# Patient Record
Sex: Male | Born: 1990 | Race: White | Hispanic: No | Marital: Single | Smoking: Never smoker
Health system: Southern US, Community
[De-identification: ages and names within clinical notes are randomized; demographics above are authoritative.]

## PROBLEM LIST (undated history)

## (undated) DIAGNOSIS — M79606 Pain in leg, unspecified: Secondary | ICD-10-CM

## (undated) DIAGNOSIS — L409 Psoriasis, unspecified: Secondary | ICD-10-CM

## (undated) DIAGNOSIS — K922 Gastrointestinal hemorrhage, unspecified: Secondary | ICD-10-CM

## (undated) DIAGNOSIS — F419 Anxiety disorder, unspecified: Secondary | ICD-10-CM

## (undated) DIAGNOSIS — K5900 Constipation, unspecified: Secondary | ICD-10-CM

## (undated) DIAGNOSIS — K264 Chronic or unspecified duodenal ulcer with hemorrhage: Secondary | ICD-10-CM

## (undated) DIAGNOSIS — F329 Major depressive disorder, single episode, unspecified: Secondary | ICD-10-CM

## (undated) DIAGNOSIS — R519 Headache, unspecified: Secondary | ICD-10-CM

## (undated) DIAGNOSIS — E669 Obesity, unspecified: Secondary | ICD-10-CM

## (undated) DIAGNOSIS — K279 Peptic ulcer, site unspecified, unspecified as acute or chronic, without hemorrhage or perforation: Secondary | ICD-10-CM

## (undated) DIAGNOSIS — F32A Depression, unspecified: Secondary | ICD-10-CM

## (undated) DIAGNOSIS — R51 Headache: Secondary | ICD-10-CM

## (undated) DIAGNOSIS — K260 Acute duodenal ulcer with hemorrhage: Secondary | ICD-10-CM

## (undated) HISTORY — DX: Headache, unspecified: R51.9

## (undated) HISTORY — DX: Major depressive disorder, single episode, unspecified: F32.9

## (undated) HISTORY — DX: Constipation, unspecified: K59.00

## (undated) HISTORY — DX: Anxiety disorder, unspecified: F41.9

## (undated) HISTORY — DX: Psoriasis, unspecified: L40.9

## (undated) HISTORY — DX: Acute duodenal ulcer with hemorrhage: K26.0

## (undated) HISTORY — DX: Depression, unspecified: F32.A

## (undated) HISTORY — DX: Pain in leg, unspecified: M79.606

## (undated) HISTORY — DX: Peptic ulcer, site unspecified, unspecified as acute or chronic, without hemorrhage or perforation: K27.9

## (undated) HISTORY — DX: Chronic or unspecified duodenal ulcer with hemorrhage: K26.4

## (undated) HISTORY — DX: Gastrointestinal hemorrhage, unspecified: K92.2

## (undated) HISTORY — DX: Obesity, unspecified: E66.9

## (undated) HISTORY — DX: Headache: R51

---

## 2010-02-02 ENCOUNTER — Ambulatory Visit: Payer: Self-pay | Admitting: Interventional Radiology

## 2010-02-02 ENCOUNTER — Emergency Department (HOSPITAL_BASED_OUTPATIENT_CLINIC_OR_DEPARTMENT_OTHER): Admission: EM | Admit: 2010-02-02 | Discharge: 2010-02-02 | Payer: Self-pay | Admitting: Emergency Medicine

## 2010-02-02 ENCOUNTER — Ambulatory Visit: Payer: Self-pay | Admitting: Emergency Medicine

## 2010-02-02 DIAGNOSIS — R1084 Generalized abdominal pain: Secondary | ICD-10-CM

## 2010-08-23 HISTORY — PX: WISDOM TOOTH EXTRACTION: SHX21

## 2010-09-22 NOTE — Assessment & Plan Note (Signed)
Summary: STOMACH PAIN/WB   Vital Signs:  Patient Profile:   20 Years Old Male CC:      lower back and lower abdominal pain, testicular soreness X 1 hour ago Height:     70 inches Weight:      167 pounds O2 Sat:      99 % O2 treatment:    Room Air Temp:     97.0 degrees F oral Pulse rate:   87 / minute Resp:     18 per minute BP sitting:   115 / 73  (right arm) Cuff size:   regular  Pt. in pain?   yes    Location:   lower back radiating to lower abdomen    Intensity:   7    Type:       aching with sharp at times  Vitals Entered By: Lajean Saver RN (February 02, 2010 1:32 PM)                   Current Allergies: ! * SEASONALHistory of Present Illness History from: patient and mom Chief Complaint: lower back and lower abdominal pain, testicular soreness X 1 hour ago History of Present Illness: 20yo WM with abd pain for 1 hour.  Started as testicular pain radiating to right flank and RLQ.  No problems this morning with urination.  No trauma.  No physical activity.  7/10 pain, no N/V or other constitutional symptoms.  No dysuria, constipation, frequency.  He has never been sexually active.  REVIEW OF SYSTEMS Constitutional Symptoms      Denies fever, chills, night sweats, weight loss, weight gain, and fatigue.  Eyes       Denies change in vision, eye pain, eye discharge, glasses, contact lenses, and eye surgery. Ear/Nose/Throat/Mouth       Denies hearing loss/aids, change in hearing, ear pain, ear discharge, dizziness, frequent runny nose, frequent nose bleeds, sinus problems, sore throat, hoarseness, and tooth pain or bleeding.  Respiratory       Denies dry cough, productive cough, wheezing, shortness of breath, asthma, bronchitis, and emphysema/COPD.  Cardiovascular       Denies murmurs, chest pain, and tires easily with exhertion.    Gastrointestinal       Complains of stomach pain.      Denies nausea/vomiting, diarrhea, constipation, blood in bowel movements, and  indigestion.      Comments: lowerlower back Genitourniary       Denies painful urination, kidney stones, and loss of urinary control.      Comments: aching testicles Neurological       Denies paralysis, seizures, and fainting/blackouts. Musculoskeletal       Complains of muscle pain.      Denies joint pain, joint stiffness, decreased range of motion, redness, swelling, muscle weakness, and gout.  Skin       Denies bruising, unusual mles/lumps or sores, and hair/skin or nail changes.  Psych       Denies mood changes, temper/anger issues, anxiety/stress, speech problems, depression, and sleep problems. Other Comments: symptoms started one hour ago in lower back. denies N/V/D   Past History:  Past Medical History: Unremarkable  Past Surgical History: Denies surgical history  Family History: Chrones- grandfather Factor 5- mother Breast CA- mother  Social History: Never Smoked Alcohol use-no Drug use-no Smoking Status:  never Drug Use:  no Physical Exam General appearance: moderate distress, A&Ox3 Head: normocephalic, atraumatic Chest/Lungs: no rales, wheezes, or rhonchi bilateral, breath sounds equal without effort Heart:  regular rate and  rhythm, no murmur Abdomen: +TTP RLQ and suprapubic and some generalized tenderness.  No rebound.  +guarding.  +BS4Q. GU: +TTP R>L testicle, neither is high-riding, no hernia felt MSE: oriented to time, place, and person Assessment New Problems: ABDOMINAL PAIN (ICD-789.00)   The patient and/or caregiver has been counseled thoroughly with regard to medications prescribed including dosage, schedule, interactions, rationale for use, and possible side effects and they verbalize understanding.  Diagnoses and expected course of recovery discussed and will return if not improved as expected or if the condition worsens. Patient and/or caregiver verbalized understanding.   Patient Instructions: 1)  Patient & mother advised to go directly to the  ER for UA, blood work, and CT scan with possible U/S or testicles.  They agree and will go now.  Orders Added: 1)  New Patient Level II [99202]

## 2010-11-09 LAB — URINALYSIS, ROUTINE W REFLEX MICROSCOPIC
Bilirubin Urine: NEGATIVE
Nitrite: NEGATIVE
Protein, ur: NEGATIVE mg/dL
Urobilinogen, UA: 0.2 mg/dL (ref 0.0–1.0)
pH: 8 (ref 5.0–8.0)

## 2012-03-15 ENCOUNTER — Ambulatory Visit (INDEPENDENT_AMBULATORY_CARE_PROVIDER_SITE_OTHER): Payer: Self-pay | Admitting: Physician Assistant

## 2012-03-15 ENCOUNTER — Encounter: Payer: Self-pay | Admitting: Physician Assistant

## 2012-03-15 VITALS — BP 117/74 | HR 78 | Ht 72.0 in | Wt 211.0 lb

## 2012-03-15 DIAGNOSIS — F411 Generalized anxiety disorder: Secondary | ICD-10-CM

## 2012-03-15 DIAGNOSIS — F329 Major depressive disorder, single episode, unspecified: Secondary | ICD-10-CM

## 2012-03-15 DIAGNOSIS — F3289 Other specified depressive episodes: Secondary | ICD-10-CM

## 2012-03-15 DIAGNOSIS — F419 Anxiety disorder, unspecified: Secondary | ICD-10-CM

## 2012-03-15 MED ORDER — SERTRALINE HCL 50 MG PO TABS: 50.0000 mg | ORAL_TABLET | Freq: Every day | ORAL | Status: DC

## 2012-03-15 NOTE — Progress Notes (Signed)
  Subjective:    Patient ID: Leonard Lucas, male    DOB: 26-Mar-1991, 21 y.o.   MRN: [id number]  HPI Patient presents to the clinic as a new patient to establish care. Past medical history was negative for any diseases or medical management. He is not currently on any medications. He comes in to discuss worsening depression. He has never sought any medical help with feelings of hopelessness and helplessness. He reports that he history we'll with feeling down since he was in middle school. It worse and when he went to high school. He is now 21 years old and graduated from high school working at [employer]. Last week he knew he needed to seek help when he thought about taking a whole bottle of ibuprofen to see if it would hurt him. This was started by an incident where he passed gas in front of a  Shopper. This really embarrassed down and he felt like he can do nothing right. It is also bothering him that his parents are going through a divorce. He feels like he has no one to talk to despite having friends. He is having problems focusing to do his class work while he takes classes at [employer]. All he wants to do is sleep. He has not wanted to eat at all. He does have an appointment to talk to a therapist tomorrow.     Review of Systems     Objective:   Physical Exam  Constitutional: He is oriented to person, place, and time. He appears well-developed and well-nourished.  HENT:  Head: Normocephalic.  Cardiovascular: Normal rate, regular rhythm and normal heart sounds.   Pulmonary/Chest: Effort normal and breath sounds normal.  Neurological: He is alert and oriented to person, place, and time.  Skin: Skin is warm and dry.  Psychiatric:        Flat affect. Patient cried during encounter.          Assessment & Plan:  Anxiety and depression-PHQ-9 was 22 and GAD-7 was 17. Patient was started on Zoloft 50 mg one half tab for 7 days then increase to 1 tab. He was given a handout on  depression. He is aware of the potential side effects of Zoloft and was told to call office if he had any worsening depression. Patient was given a number for suicide hotline if he had any more thoughts of hurting himself or others. He was encouraged to keep appointment with therapist tomorrow. Followup in 4-6 weeks.

## 2012-03-15 NOTE — Patient Instructions (Addendum)
Exercise can increase feel good hormones. Will start Zoloft 1/2 tab for 7 days then increase to 1 tab. Follow up in 6 weeks. Call if any side effects or worsening depression.   Depression, Adolescent and Adult Depression is a true and treatable medical condition. In general there are two kinds of depression:  Depression we all experience in some form. For example depression from the death of a loved one, financial distress or natural disasters will trigger or increase depression.   Clinical depression, on the other hand, appears without an apparent cause or reason. This depression is a disease. Depression may be caused by chemical imbalance in the body and brain or may come as a response to a physical illness. Alcohol and other drugs can cause depression.  DIAGNOSIS  The diagnosis of depression is usually based upon symptoms and medical history. TREATMENT  Treatments for depression fall into three categories. These are:  Drug therapy. There are many medicines that treat depression. Responses may vary and sometimes trial and error is necessary to determine the best medicines and dosage for a particular patient.   Psychotherapy, also called talking treatments, helps people resolve their problems by looking at them from a different point of view and by giving people insight into their own personal makeup. Traditional psychotherapy looks at a childhood source of a problem. Other psychotherapy will look at current conflicts and move toward solving those. If the cause of depression is drug use, counseling is available to help abstain. In time the depression will usually improve. If there were underlying causes for the chemical use, they can be addressed.   ECT (electroconvulsive therapy) or shock treatment is not as commonly used today. It is a very effective treatment for severe suicidal depression. During ECT electrical impulses are applied to the head. These impulses cause a generalized seizure. It can  be effective but causes a loss of memory for recent events. Sometimes this loss of memory may include the last several months.  Treat all depression or suicide threats as serious. Obtain professional help. Do not wait to see if serious depression will get better over time without help. Seek help for yourself or those around you. In the [place] the number to the National Suicide Help Lines With 24 Hour Help Are: 1-800-SUICIDE (931)581-6327 Document Released: 08/06/2000 Document Revised: 07/29/2011 Document Reviewed: 03/27/2008 [facility] Patient Information 2012 [place], [place].

## 2012-03-16 ENCOUNTER — Ambulatory Visit (INDEPENDENT_AMBULATORY_CARE_PROVIDER_SITE_OTHER): Payer: 59 | Admitting: Psychiatry

## 2012-03-16 ENCOUNTER — Encounter (HOSPITAL_COMMUNITY): Payer: Self-pay | Admitting: Psychiatry

## 2012-03-16 VITALS — BP 126/74 | HR 78 | Ht 72.0 in | Wt 225.0 lb

## 2012-03-16 DIAGNOSIS — F331 Major depressive disorder, recurrent, moderate: Secondary | ICD-10-CM | POA: Insufficient documentation

## 2012-03-16 DIAGNOSIS — F332 Major depressive disorder, recurrent severe without psychotic features: Secondary | ICD-10-CM

## 2012-03-16 NOTE — Progress Notes (Signed)
Psychiatric Assessment Adult  Patient Identification:  Leonard Lucas Date of Evaluation:  03/16/2012 Chief Complaint:   Chief Complaint  Patient presents with  . Anxiety  . Depression   History of Chief Complaint:   HPI Comments: Leonard Lucas is a 21 y/o male with a past psychiatric history significant for symptoms of depression and anxiety. The patient is referred for psychiatric services for psychiatric evaluation and medication.   The patient reports that her main stressors are: " Past Memories and failures"- the patient reports a relationship ended and that person tried to kill himself and he has carried the guilt of this since 6 years-he states that this was an online relationship but that they are still friends; "Instability at work"- he reports that a Writer has come in and his work duties changed as their were issues with the International aid/development worker; "parents splitting up"- he reports that his parents are separating; Mother had cancer- he reports that he trying not think about it as his mother has been in remission.   Anxiety Presents for initial visit. Onset was more than 5 years ago. The problem has been gradually worsening. Symptoms include confusion, decreased concentration, depressed mood, excessive worry, feeling of choking, irritability, nervous/anxious behavior and obsessions. Patient reports no chest pain, hyperventilation, palpitations or panic. Symptoms occur most days. The most recent episode lasted 3 hours. The severity of symptoms is causing significant distress. The symptoms are aggravated by family issues and work stress. The patient sleeps 9 hours per night. The quality of sleep is non-restorative. Nighttime awakenings: one to two.   Risk factors include a major life event and family history. Past treatments include nothing. The treatment provided no relief. Compliance with prior treatments has been good.   In the area of affective symptoms, patient appears mildly anxious.  Patient denies current suicidal ideation, intent, or plan. Patient reports that he had suicidal thoughts two weeks ago for a couple hours, but then overcame.   Patient denies current homicidal ideation, intent, or plan. Patient denies auditory hallucinations. Patient denies visual hallucinations. Patient endorses symptoms of paranoia-thinks that people think he is a bad person. He has been thinking that his thoughts are broadcasted since 6th grade. Patient states sleep is poor, though he sleeps approximately 8-10 hours of sleep per night.  Appetite is fair. Energy level poor. Patient endorses symptoms of anhedonia for 5 years. Patient endorses hopelessness, helplessness, and guilt-( regarding her past 2 relationships.)   Denies any recent episodes consistent with mania, particularly decreased need for sleep with increased energy, grandiosity, impulsivity, hyperverbal and pressured speech, or increased productivity. Denies any recent symptoms consistent with psychosis, particularly auditory or visual hallucinations, insertion/withdrawal, or ideas of reference. . Denies any history of trauma or symptoms consistent with PTSD such as flashbacks, nightmares, hypervigilance, feelings of numbness or inability to connect with others.   The patient reports his symptoms were present but "mild" throughout middle school and increased during highschool. He reports his symptom lead to lack of energy and concentration to projects.  Review of Systems  Constitutional: Positive for appetite change and irritability. Negative for fever, chills, diaphoresis, activity change, fatigue and unexpected weight change.  Respiratory: Negative.   Cardiovascular: Negative.  Negative for chest pain, palpitations and leg swelling.  Gastrointestinal: Negative.   Psychiatric/Behavioral: Positive for confusion and decreased concentration. The patient is nervous/anxious.    Physical Exam  Vitals reviewed. Constitutional: He appears  well-developed and well-nourished. No distress.  Skin: He is not diaphoretic.  Past Psychiatric History: Diagnosis: None  Hospitalizations: Patient denies.  Outpatient Care: Patient denies.  Substance Abuse Care:Patient denies.  Self-Mutilation: The patient cuts himself since age 36 periodically up until 2012  Suicidal Attempts: Patient denies.  Violent Behaviors: Patient denies.   Past Medical History:  History reviewed. No pertinent past medical history.  History of Loss of Consciousness:  No Seizure History:  No Cardiac History:  No Allergies:  No Known Allergies  Current Medications:  Current Outpatient Prescriptions  Medication Sig Dispense Refill  . sertraline (ZOLOFT) 50 MG tablet Take 1 tablet (50 mg total) by mouth daily. Take 1/2 tab for 7 days then increase to 1 tab.  30 tablet  1   Previous Psychotropic Medications:  Medication Dose   Sertraline-started yesterday  25 mg    Substance Abuse History in the last 12 months: SUBSTANCE USE HISTORY:  Caffeine: Caffeinated Beverages 16 ounces person. Nicotine: Patient denies.  Alcohol: Patient denies.  Illicit Drugs: Patient denies.   MENTAL ILLNESS AND SUBSTANCE ABUSE IN FAMILY MEMBERS:  Psychiatric illness: Mother -depression Substance abuse: Mother Nicotine use. Suicides: Patient denies.  Social History: Current Place of Residence: [place], [place] Place of Birth: [place], [place] Family Members: Lives with his father Marital Status:  Single Children: None Relationships:  Education:  Software engineer Problems/Performance: A's; B's Religious Beliefs/Practices: None History of Abuse: none Occupational Experiences: Sales associate in [employer] History:  None. Legal History: None  Hobbies/Interests: Online gaming and chatting.   Family History:  History reviewed. No pertinent family history.  Mental Status Examination/Evaluation: Objective:  Appearance: Casual  Eye Contact::  Fair    Speech:  Clear and Coherent and Normal Rate  Volume:  Normal  Mood:  "Average."  Affect:  Blunt  Thought Process:  Circumstantial, Linear and Logical  Orientation:  Full  Thought Content:  WDL  Suicidal Thoughts:  No  Homicidal Thoughts:  No  Judgement:  Good  Insight:  Good  Psychomotor Activity:  Normal  Akathisia:  No  Handed:  Right  AIMS (if indicated):  None  Assets:  Communication Skills Desire for Improvement Financial Resources/Insurance Housing Leisure Time Physical Health Social Support Talents/Skills Transportation    Laboratory/X-Ray Psychological Evaluation(s)   None   None   Assessment:    AXIS I Major Depression, Recurrent severe  AXIS II No diagnosis  AXIS III History reviewed. No pertinent past medical history.   AXIS IV educational problems, housing problems, occupational problems and other psychosocial or environmental problems-parents are separating  AXIS V 51-60 moderate symptoms   Treatment Plan/Recommendations:  1. Affirm with the patient that the medications are taken as ordered. Patient expressed understanding of how their medications were to be used.  2. Continue the following psychiatric medications as written prior to this appointment:  a) Continue Sertraline 25 mg for 6 days then increase to one whole tablet 50 mg daily. 3. Therapy: brief supportive therapy provided. Discussed psychosocial stressors in detail.  4. Risks and benefits, side effects and alternatives discussed with patient, he was given an opportunity to ask questions about his medication, illness, and treatment. All current psychiatric  medications have been reviewed and discussed with the patient and adjusted as clinically appropriate. The patient has been provided an accurate and updated list of the medications being now prescribed.  5. Patient told to call clinic if any problems occur. Patient advised to go to ER  if he should develop SI/HI, side effects, or if symptoms  worsen. Has crisis numbers to  call if needed.   6. No labs warranted at this time. 7. The patient was encouraged to keep all PCP and specialty clinic appointments.  8. Patient was instructed to return to clinic in 1 month.  9. The patient was advised to call and cancel their mental health appointment within 24 hours of the appointment, if they are unable to keep the appointment.  10. The patient expressed understanding of the plan and agrees with the above.  Jacqulyn Cane, MD 7/25/20139:03 AM

## 2012-03-29 ENCOUNTER — Encounter: Payer: Self-pay | Admitting: Physician Assistant

## 2012-03-29 ENCOUNTER — Ambulatory Visit (INDEPENDENT_AMBULATORY_CARE_PROVIDER_SITE_OTHER): Payer: 59 | Admitting: Physician Assistant

## 2012-03-29 VITALS — BP 133/81 | HR 99 | Ht 72.0 in | Wt 208.0 lb

## 2012-03-29 DIAGNOSIS — F410 Panic disorder [episodic paroxysmal anxiety] without agoraphobia: Secondary | ICD-10-CM

## 2012-03-29 DIAGNOSIS — F329 Major depressive disorder, single episode, unspecified: Secondary | ICD-10-CM

## 2012-03-29 DIAGNOSIS — F419 Anxiety disorder, unspecified: Secondary | ICD-10-CM

## 2012-03-29 DIAGNOSIS — F3289 Other specified depressive episodes: Secondary | ICD-10-CM

## 2012-03-29 DIAGNOSIS — F411 Generalized anxiety disorder: Secondary | ICD-10-CM

## 2012-03-29 MED ORDER — CLONAZEPAM 0.25 MG PO TBDP: 0.2500 mg | ORAL_TABLET | ORAL | Status: DC | PRN

## 2012-03-29 MED ORDER — ESCITALOPRAM OXALATE 10 MG PO TABS: 10.0000 mg | ORAL_TABLET | Freq: Every day | ORAL | Status: DC

## 2012-03-29 NOTE — Progress Notes (Signed)
  Subjective:    Patient ID: Leonard Lucas, male    DOB: 07-Sep-1990, 21 y.o.   MRN: [id number]  HPI Patient comes into today because depression and anxiety have worsened since starting Zoloft. He noticed that the worsening feelings of hopelessness and helplessness came when he went up to full dose. He did go to see Dr. Blenda Peals downstairs and no changes were made to his meds. He does have a follow up with him in a couple of weeks. He does report that his thoughts of not wanting to exist have increased. Last dose of Zoloft was Sunday, 3 days ago. Sunday was also when his dad came home drunk talking about divorce to him. This made him feel worse. Denies any illegal drugs or alcohol usage. Although feels like he doesn't want to exist he denies any plans of suicide and wants to be happy again. He has had recent episode of anxiety where he feels like he can't function and his chest begins to feel tight. This happened when his dad came home drunk on Sunday.   Review of Systems     Objective:   Physical Exam  Constitutional: He is oriented to person, place, and time. He appears well-developed and well-nourished.  HENT:  Head: Normocephalic and atraumatic.  Cardiovascular: Normal rate, regular rhythm and normal heart sounds.   Pulmonary/Chest: Effort normal and breath sounds normal.  Neurological: He is alert and oriented to person, place, and time.  Skin: Skin is warm and dry.  Psychiatric:       Cried most of encounter. Flat affect.           Assessment & Plan:  Depression/anxiety/panic attacks-GAD-7 was 20 and PHQ-9 was 24. pt has stopped Zoloft. Gave rx for lexapro to start along with clonazepam prn for acute attacks. Discussed with patient how Dr. Demetrius Charity downstairs is the one he needs to follow up. I want him to keep appt. Gave handout for Depression and numbers to call if he had thoughts of suicide. I also told him to call if he started medication and he thought symptoms were worsening so that he  could stop and maybe get him in sooner downstairs. Think he would benefit from counseling on a regular basis weekly. Told pt to discuss with Dr. Demetrius Charity.

## 2012-03-29 NOTE — Patient Instructions (Addendum)
Need to follow up with Dr. Demetrius Charity.  Depression, Adolescent and Adult Depression is a true and treatable medical condition. In general there are two kinds of depression:  Depression we all experience in some form. For example depression from the death of a loved one, financial distress or natural disasters will trigger or increase depression.   Clinical depression, on the other hand, appears without an apparent cause or reason. This depression is a disease. Depression may be caused by chemical imbalance in the body and brain or may come as a response to a physical illness. Alcohol and other drugs can cause depression.  DIAGNOSIS  The diagnosis of depression is usually based upon symptoms and medical history. TREATMENT  Treatments for depression fall into three categories. These are:  Drug therapy. There are many medicines that treat depression. Responses may vary and sometimes trial and error is necessary to determine the best medicines and dosage for a particular patient.   Psychotherapy, also called talking treatments, helps people resolve their problems by looking at them from a different point of view and by giving people insight into their own personal makeup. Traditional psychotherapy looks at a childhood source of a problem. Other psychotherapy will look at current conflicts and move toward solving those. If the cause of depression is drug use, counseling is available to help abstain. In time the depression will usually improve. If there were underlying causes for the chemical use, they can be addressed.   ECT (electroconvulsive therapy) or shock treatment is not as commonly used today. It is a very effective treatment for severe suicidal depression. During ECT electrical impulses are applied to the head. These impulses cause a generalized seizure. It can be effective but causes a loss of memory for recent events. Sometimes this loss of memory may include the last several months.  Treat all  depression or suicide threats as serious. Obtain professional help. Do not wait to see if serious depression will get better over time without help. Seek help for yourself or those around you. In the [place] the number to the National Suicide Help Lines With 24 Hour Help Are: 1-800-SUICIDE (404)129-0115 Document Released: 08/06/2000 Document Revised: 07/29/2011 Document Reviewed: 03/27/2008 [facility] Patient Information 2012 [place], [place].

## 2012-04-20 ENCOUNTER — Ambulatory Visit (HOSPITAL_COMMUNITY): Payer: Self-pay | Admitting: Psychiatry

## 2012-05-03 ENCOUNTER — Encounter (HOSPITAL_COMMUNITY): Payer: Self-pay | Admitting: Psychiatry

## 2012-05-03 ENCOUNTER — Ambulatory Visit (INDEPENDENT_AMBULATORY_CARE_PROVIDER_SITE_OTHER): Payer: 59 | Admitting: Psychiatry

## 2012-05-03 VITALS — BP 125/81 | HR 73 | Ht 72.0 in | Wt 212.0 lb

## 2012-05-03 DIAGNOSIS — F331 Major depressive disorder, recurrent, moderate: Secondary | ICD-10-CM

## 2012-05-03 DIAGNOSIS — F332 Major depressive disorder, recurrent severe without psychotic features: Secondary | ICD-10-CM

## 2012-05-03 MED ORDER — ESCITALOPRAM OXALATE 10 MG PO TABS: 10.0000 mg | ORAL_TABLET | Freq: Every day | ORAL | Status: DC

## 2012-05-03 NOTE — Progress Notes (Signed)
[facility] Follow-up Outpatient Visit  Leonard Lucas [birth year]  DATE: 05/05/2012  .  Anxiety   .  Depression    History of Chief Complaint:  HPI Comments: Leonard Lucas is a 21 y/o male with a past psychiatric history significant for symptoms of depression and anxiety. The patient is referred for psychiatric services for medication management.   The patient reports that his was not doing well on his previous antidepressant and went to his PCP.  He reports he was switched to clonazepam (which has not used in over one week) and escitalopram 10 mg. He states he is doing well, and denies any side effects.  The patient reports that he is dealing with work related stress better. The patient reports that he online relationship with his friends remain the same. The patient reports that his parents relationship is improving which has improved his mood. He reports his mother moved out 2 months ago, but has recently starting going out on dates.  He reports that he felt he was getting "manic."  He report his energy levels were fluctuating from high to low. He reports he was fluctuating from pressured speech to not talking. He reports that he had one night of not sleep. He does report some stress about his dog who is 59 years old and suffering some health problems.  In the area of affective symptoms, patient appears euthymic. Patient denies current suicidal ideation, intent, or plan. Patient denies current homicidal ideation, intent, or plan. Patient denies auditory hallucinations. Patient denies visual hallucinations. Patient denies symptoms of paranoia. Patient states sleep is good. Appetite is good. Energy level is good. Patient denies symptoms of anhedonia. Patient denies hopelessness, helplessness, or guilt.    Denies any recent episodes consistent with mania, particularly decreased need for sleep with increased energy, grandiosity, impulsivity, hyperverbal and pressured speech, or increased  productivity. Denies any recent symptoms consistent with psychosis, particularly auditory or visual hallucinations, thought broadcasting/insertion/withdrawal, or ideas of reference. Also denies excessive worry to the point of physical symptoms as well as any panic attacks. Denies any history of trauma or symptoms consistent with PTSD such as flashbacks, nightmares, hypervigilance, feelings of numbness or inability to connect with others.   Review of Systems  Constitutional:  Negative for change in appetite, fever, chills, diaphoresis, activity change, fatigue and unexpected weight change.  Respiratory: Negative.  Cardiovascular: Negative. Negative for chest pain, palpitations and leg swelling.  Gastrointestinal: Negative.    Physical Exam  Vitals reviewed.  Constitutional: He appears well-developed and well-nourished. No distress.  Skin: He is not diaphoretic.   Past Psychiatric History:  Diagnosis: None   Hospitalizations: Patient denies.   Outpatient Care: Patient denies.   Substance Abuse Care:Patient denies.   Self-Mutilation: The patient cuts himself since age 74 periodically up until 2012   Suicidal Attempts: Patient denies.   Violent Behaviors: Patient denies.    Past Medical History: History reviewed. No pertinent past medical history.   History of Loss of Consciousness: No  Seizure History: No  Cardiac History: No  Allergies: No Known Allergies   Current Medications: Current Outpatient Prescriptions on File Prior to Visit  Medication Sig Dispense Refill  . escitalopram (LEXAPRO) 10 MG tablet Take 1 tablet (10 mg total) by mouth daily.  30 tablet  1  . clonazePAM (KLONOPIN) 0.25 MG disintegrating tablet Take 1 tablet (0.25 mg total) by mouth as needed. For anxiety no more than twice a day.  30 tablet  0   Previous Psychotropic  Medications:  Medication  Dose   Sertraline-started yesterday  25 mg    Substance Abuse History in the last 12 months:   SUBSTANCE USE  HISTORY:  Caffeine: Caffeinated Beverages 16 ounces person.  Nicotine: Patient denies.  Alcohol: Patient denies.  Illicit Drugs: Patient denies.   MENTAL ILLNESS AND SUBSTANCE ABUSE IN FAMILY MEMBERS:  Psychiatric illness: Mother -depression  Substance abuse: Mother Nicotine use.  Suicides: Patient denies.   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  Legal History: None  Hobbies/Interests: Online gaming and chatting.   Family History: History reviewed. No pertinent family history.   Mental Status Examination/Evaluation:  Objective: Appearance: Casual   Eye Contact:: Fair   Speech: Clear and Coherent and Normal Rate   Volume: Normal   Mood: "positive"   Affect: Blunt   Thought Process: Circumstantial, Linear and Logical   Orientation: Full   Thought Content: WDL   Suicidal Thoughts: No   Homicidal Thoughts: No   Judgement: Good   Insight: Good   Psychomotor Activity: Normal   Akathisia: No   Handed: Right   AIMS (if indicated): None   Assets: Communication Skills  Desire for Improvement  Financial Resources/Insurance  Housing  Leisure Time  Physical Health  Social Support  Talents/Skills  Transportation    Laboratory/X-Ray  Psychological Evaluation(s)   None  None    Assessment:  AXIS I  Major Depression, Recurrent severe   AXIS II  No diagnosis   AXIS III  History reviewed. No pertinent past medical history.   AXIS IV  educational problems, housing problems, occupational problems and other psychosocial or environmental problems-parents are separating   AXIS V  51-60 moderate symptoms    Treatment  Plan/Recommendations:  1. Affirm with the patient that the medications are taken as ordered. Patient expressed understanding of how their medications were to be used.  I applauded the patient's efforts to take control of his mental health and seek treatment when he was having problems with his medications, and advised him of the option to call this clinic if he should have problems with the same. 2. Continue the following psychiatric medications as written prior to this appointment:  a) Lexapro 10 mg- I have asked the patient to call the clinic if he is having a return of depressive symptoms prior to his next appointment, if so will increase medications. Will monitor for manic symptoms.  B) Will not refill clonazepam, advised patient to take remaining medication only if he has a panic attack. 3. Therapy: brief supportive therapy provided. Discussed psychosocial stressors in detail.  4. Risks and benefits, side effects and alternatives discussed with patient, he was given an opportunity to ask questions about his medication, illness, and treatment. All current psychiatric  medications have been reviewed and discussed with the patient and adjusted as clinically appropriate. The patient has been provided an accurate and updated list of the medications being now prescribed.  5. Patient told to call clinic if any problems occur. Patient advised to go to ER if he should develop SI/HI, side effects, or if symptoms worsen. Has crisis numbers to call if needed.  6. No labs warranted at this time.  7. The  patient was encouraged to keep all PCP and specialty clinic appointments.  8. Patient was instructed to return to clinic in 1 month.  9. The patient was advised to call and cancel their mental health appointment within 24 hours of the appointment, if they are unable to keep the appointment.  10. The patient expressed understanding of the plan and agrees with the above.     Jacqulyn Cane, MD

## 2012-05-31 ENCOUNTER — Ambulatory Visit (INDEPENDENT_AMBULATORY_CARE_PROVIDER_SITE_OTHER): Payer: 59 | Admitting: Psychiatry

## 2012-05-31 ENCOUNTER — Encounter (HOSPITAL_COMMUNITY): Payer: Self-pay | Admitting: Psychiatry

## 2012-05-31 VITALS — BP 105/68 | HR 61 | Ht 72.0 in | Wt 216.0 lb

## 2012-05-31 DIAGNOSIS — F339 Major depressive disorder, recurrent, unspecified: Secondary | ICD-10-CM

## 2012-05-31 DIAGNOSIS — F331 Major depressive disorder, recurrent, moderate: Secondary | ICD-10-CM

## 2012-05-31 MED ORDER — ESCITALOPRAM OXALATE 10 MG PO TABS: 10.0000 mg | ORAL_TABLET | Freq: Every day | ORAL | Status: DC

## 2012-05-31 NOTE — Progress Notes (Signed)
[facility] Follow-up Outpatient Visit  Leonard Lucas April 07, 1991  Date: 05/31/2012  .  Anxiety   .  Depression    History of Chief Complaint:    HPI Comments: Leonard Lucas is a 21 y/o male with a past psychiatric history significant for symptoms of depression and anxiety. The patient is referred for psychiatric services for medication management.  he patient had one week of irritability which resolved. He reports that the week of irritability was secondary to some work related stressors. He reports he would like to go to [facility] next month, and plans to go with his father.  He has taken a gym membership but has not gone as of yet. The patient reports that he has been trying to end a relationship with a person he had dated in the past but feels like he would be a terrible person if he did.  He does however realize that that person is not good for him emotionally.  In the area of affective symptoms, patient appears euthymic. Patient denies current suicidal ideation, intent, or plan. Patient denies current homicidal ideation, intent, or plan. Patient denies auditory hallucinations. Patient denies visual hallucinations. Patient denies symptoms of paranoia. Patient states sleep is good. Appetite is good. Energy level is increased. Patient denies symptoms of anhedonia. Patient denies hopelessness, helplessness, or guilt.   Denies any  episodes consistent with mania, particularly decreased need for sleep with increased energy, grandiosity, impulsivity, hyperverbal and pressured speech, or increased productivity. Denies any recent symptoms consistent with psychosis, particularly auditory or visual hallucinations, thought broadcasting/insertion/withdrawal, or ideas of reference. Also denies excessive worry to the point of physical symptoms as well as any panic attacks. Denies any history of trauma or symptoms consistent with PTSD such as flashbacks, nightmares, hypervigilance, feelings of numbness  or inability to connect with others.   Review of Systems  Constitutional: Negative for change in appetite, fever, chills, diaphoresis, activity change, fatigue and unexpected weight change.  Respiratory: Negative.  Cardiovascular: Negative. Negative for chest pain, palpitations and leg swelling.  Gastrointestinal: Negative.   Filed Vitals:   05/31/12 1300  BP: 105/68  Pulse: 61  Height: 6' (1.829 m)  Weight: 216 lb (97.977 kg)    Physical Exam  Vitals reviewed.  Constitutional: He appears well-developed and well-nourished. No distress.  Skin: He is not diaphoretic.   Past Psychiatric History:  Diagnosis: None   Hospitalizations: Patient denies.   Outpatient Care: Patient denies.   Substance Abuse Care:Patient denies.   Self-Mutilation: The patient cuts himself since age 58 periodically up until 2012   Suicidal Attempts: Patient denies.   Violent Behaviors: Patient denies.    Past Medical History: History reviewed. No pertinent past medical history.  History of Loss of Consciousness: No   Seizure History: No  Cardiac History: No  Allergies: No Known Allergies   Current Medications: Reviewed Current Outpatient Prescriptions on File Prior to Visit   Medication  Sig  Dispense  Refill   .  escitalopram (LEXAPRO) 10 MG tablet  Take 1 tablet (10 mg total) by mouth daily.  30 tablet  1   .  clonazePAM (KLONOPIN) 0.25 MG disintegrating tablet  Has taken one tablet a day for 7 days two weeks ago     Previous Psychotropic Medications: Reviewed Medication  Dose   Sertraline-started yesterday  25 mg     SUBSTANCE USE HISTORY: Reviewed Caffeine: Caffeinated Beverages 16 ounces person.  Nicotine: Patient denies.  Alcohol: Patient denies.  Illicit Drugs: Patient denies.  MENTAL ILLNESS AND SUBSTANCE ABUSE IN FAMILY MEMBERS: Reviewed Psychiatric illness: Mother -depression  Substance abuse: Mother Nicotine use.  Suicides: Patient denies.   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  Legal History: None  Hobbies/Interests: Online gaming and chatting.  Family History: History reviewed. No pertinent family history.    Mental Status Examination/Evaluation:  Objective: Appearance: Casual   Eye Contact:: Fair   Speech: Clear and Coherent and Normal Rate   Volume: Normal   Mood: "iffy-not real good, not real bad"   Affect: Blunt   Thought Process: Circumstantial, Linear and Logical   Orientation: Full   Thought Content: WDL   Suicidal Thoughts: No   Homicidal Thoughts: No   Judgement: Good   Insight: Good   Psychomotor Activity: Normal   Akathisia: No   Handed: Right   AIMS (if indicated): None   Assets: Communication Skills  Desire for Improvement  Financial Resources/Insurance  Housing  Leisure Time  Physical Health  Social Support  Talents/Skills  Transportation    Laboratory/X-Ray  Psychological Evaluation(s)   None  None    Assessment:  AXIS I  Major Depression, Recurrent severe   AXIS II  No diagnosis   AXIS III  History reviewed. No pertinent past medical history.   AXIS IV  educational problems, housing problems, occupational problems and other psychosocial or environmental problems-parents are separating   AXIS V  51-60 moderate symptoms     Laboratory/X-Ray  Psychological Evaluation(s)   None  None    Assessment:  AXIS I  Major Depression, Recurrent severe   AXIS II  No diagnosis   AXIS III  History reviewed. No pertinent past medical history.   AXIS IV  educational problems, housing problems, occupational problems and other psychosocial or environmental  problems-parents are separating   AXIS V  51-60 moderate symptoms    Treatment Plan/Recommendations:  1. Affirm with the patient that the medications are taken as ordered. Patient expressed understanding of how their medications were to be used. I applauded the patient's efforts to take control of his mental health and seek treatment when he was having problems with his medications, and advised him of the option to call this clinic if he should have problems with the same.  2. Continue the following psychiatric medications as written prior to this appointment:  a) Lexapro 10 mg- I have asked the patient to call the clinic if he is having a return of depressive symptoms prior to his next appointment, if so will increase medications. Will continue to monitor for manic symptoms.  B) Patient has no further clonazepam remaining and has not used this medication in 2 weeks.  3. Therapy: brief supportive therapy provided. Discussed psychosocial stressors in detail.  4. Risks and benefits, side effects and alternatives discussed with patient, he was given an opportunity to ask questions about his medication, illness, and treatment. All current psychiatric  medications have been reviewed and discussed with the patient and adjusted as clinically appropriate. The patient has been provided an accurate and updated list of the medications being now prescribed.  5. Patient told to call clinic if any problems occur. Patient advised to go to ER if he  should develop SI/HI, side effects, or if symptoms worsen. Has crisis numbers to call if needed.  6. No labs warranted at this time.  7. The patient was encouraged to keep all PCP and specialty clinic appointments.  8. Patient was instructed to return to clinic in 1 month.  9. The patient was advised to call and cancel their mental health appointment within 24 hours of the appointment, if they are unable to keep the appointment.  10. The patient expressed understanding of  the plan and agrees with the above.      Jacqulyn Cane, MD

## 2012-06-30 ENCOUNTER — Ambulatory Visit (INDEPENDENT_AMBULATORY_CARE_PROVIDER_SITE_OTHER): Payer: 59 | Admitting: Psychiatry

## 2012-06-30 ENCOUNTER — Encounter (HOSPITAL_COMMUNITY): Payer: Self-pay | Admitting: Psychiatry

## 2012-06-30 VITALS — BP 117/75 | HR 69 | Ht 72.0 in | Wt 217.0 lb

## 2012-06-30 DIAGNOSIS — F332 Major depressive disorder, recurrent severe without psychotic features: Secondary | ICD-10-CM

## 2012-06-30 DIAGNOSIS — F331 Major depressive disorder, recurrent, moderate: Secondary | ICD-10-CM

## 2012-06-30 MED ORDER — ESCITALOPRAM OXALATE 10 MG PO TABS: 15.0000 mg | ORAL_TABLET | Freq: Every day | ORAL | Status: DC

## 2012-06-30 NOTE — Progress Notes (Signed)
[facility] Follow-up Outpatient Visit  Leonard Lucas 1991/04/22  Date: 06/30/2012  Chief complaint: Follow up.  History of Chief Complaint :  HPI Comments: Leonard Lucas is a 21 y/o male with a past psychiatric history significant for symptoms of depression and anxiety. The patient is referred for psychiatric services for medication management.   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  The patient reports that he has no current stressors, other than not having enough money for Christmas.  The  patient reports that he is getting out and doing activities. The patient went to the gym without feeling out of place. He endorses some episodes of depression and anxiety but not as severe as prior to starting escitalopram. The episodes occurred twice in the past 2 weeks, and he is not sure if this was related solely to work place issues. He is concerned about how he will deal with holidays in the future as his parents are now separated, though he reports that his parents are trying to behave in a civil manner towards each other.   In the area of affective symptoms, patient appears euthymic. Patient denies current suicidal ideation, intent, or plan. Patient denies current homicidal ideation, intent, or plan. Patient denies auditory hallucinations. Patient denies visual hallucinations. Patient denies symptoms of paranoia. Patient states sleep is good. Appetite is good. Energy level is good. Patient denies symptoms of anhedonia. Patient denies hopelessness, helplessness, or guilt.   Denies any episodes consistent with mania, particularly decreased need for sleep with increased energy, grandiosity, impulsivity, hyperverbal and pressured speech, or increased productivity. Denies any recent symptoms consistent with psychosis, particularly auditory or visual hallucinations, thought broadcasting/insertion/withdrawal, or ideas of reference. He  denies any panic attacks. Denies any history of trauma or symptoms consistent with PTSD such as flashbacks, nightmares, hypervigilance, feelings of numbness or inability to connect with others.   Review of Systems  Constitutional: Negative for change in appetite, fever, chills, diaphoresis, activity change, fatigue and unexpected weight change.  Respiratory: Negative.  Cardiovascular: Negative. Negative for chest pain, palpitations and leg swelling.  Gastrointestinal: Negative.    Filed Vitals:   06/30/12 1043  BP: 117/75  Pulse: 69  Height: 6' (1.829 m)  Weight: 217 lb (98.431 kg)   Physical Exam  Vitals reviewed.  Constitutional: He appears well-developed and well-nourished. No distress.  Skin: He is not diaphoretic.   Past Psychiatric History: Reviewed Diagnosis: None   Hospitalizations: Patient denies.   Outpatient Care: Patient denies.   Substance Abuse Care:Patient denies.   Self-Mutilation: The patient cuts himself since age 75 periodically up until 2012   Suicidal Attempts: Patient denies.   Violent Behaviors: Patient denies.    Past Medical History: History reviewed. No pertinent past medical history.  History of Loss of Consciousness: No  Seizure History: No  Cardiac History: No   Allergies: No Known Allergies   Current Medications: Reviewed  Current Outpatient Prescriptions on File Prior to Visit  Medication Sig Dispense Refill  . escitalopram (LEXAPRO) 10 MG tablet Take 1 tablet (10 mg total) by mouth daily.  30 tablet  1  . clonazePAM (KLONOPIN) 0.25 MG disintegrating tablet Take 1 tablet (0.25 mg total) by mouth as needed. For anxiety no more than twice a day.  30 tablet  0    Previous Psychotropic Medications: Reviewed  Medication  Dose   Sertraline-started yesterday  25 mg    SUBSTANCE USE HISTORY: Reviewed  Caffeine: Caffeinated  Beverages 16 ounces person.  Nicotine: Patient denies.  Alcohol: Patient denies.  Illicit Drugs: Patient denies.    MENTAL ILLNESS AND SUBSTANCE ABUSE IN FAMILY MEMBERS: Reviewed  Psychiatric illness: Mother -depression  Substance abuse: Mother Nicotine use.  Suicides: Patient denies.   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  Legal History: None  Hobbies/Interests: Online gaming and chatting. Going out with friends.  Family History: History reviewed. No pertinent family history.   Mental Status Examination/Evaluation:  Objective: Appearance: Casual   Eye Contact:: Fair   Speech: Clear and Coherent and Normal Rate   Volume: Normal   Mood: " Content" 7-8/10  Affect: Blunt   Thought Process: Circumstantial, Linear and Logical   Orientation: Full   Thought Content: WDL   Suicidal Thoughts: No   Homicidal Thoughts: No   Judgement: Good   Insight: Good   Psychomotor Activity: Normal   Akathisia: No   Handed: Right   AIMS (if indicated): None   Assets: Communication Skills  Desire for Improvement  Financial Resources/Insurance  Housing  Leisure Time  Physical Health  Social Support  Talents/Skills  Transportation    Laboratory/X-Ray  Psychological Evaluation(s)   None  None    Assessment:  AXIS I  Major Depression, Recurrent severe   AXIS II  No diagnosis   AXIS III  History reviewed. No pertinent past medical history.   AXIS IV  educational problems, housing problems, occupational problems and other psychosocial or environmental problems-parents are separating   AXIS V  51-60 moderate symptoms    Laboratory/X-Ray  Psychological Evaluation(s)   None  None    Assessment:  AXIS I  Major Depression, Recurrent  severe   AXIS II  No diagnosis   AXIS III  History reviewed. No pertinent past medical history.   AXIS IV  educational problems, housing problems, occupational problems and other psychosocial or environmental problems-parents are separating   AXIS V  51-60 moderate symptoms    Treatment Plan/Recommendations:  1. Affirm with the patient that the medications are taken as ordered. Patient expressed understanding of how their medications were to be used. I applauded the patient's efforts to take control of his mental health and seek treatment when he was having problems with his medications, and advised him of the option to call this clinic if he should have problems with the same.  2. Continue the following psychiatric medications as written prior to this appointment with the following changes:  a) Lexapro 10 mg-will increase to one and a half tablets- total 15 mg daily.  3. Therapy: brief supportive therapy provided. Discussed psychosocial stressors in detail.  4. Risks and benefits, side effects and alternatives discussed with patient, he was given an opportunity to ask questions about his medication, illness, and treatment. All current psychiatric medications have been reviewed and discussed with the patient and adjusted as clinically appropriate. The patient has been provided an accurate and updated list of the medications being now prescribed.  5. Patient told to call clinic if any problems occur. Patient advised to go to ER if he should develop SI/HI, side effects, or if symptoms worsen. Has crisis numbers to call if needed.  6. No  labs warranted at this time.  7. The patient was encouraged to keep all PCP and specialty clinic appointments.  8. Patient was instructed to return to clinic in 4-6 weeks.  9. The patient was advised to call and cancel their mental health appointment within 24 hours of the appointment, if they are unable to keep the appointment.  10. The patient expressed understanding  of the plan and agrees with the above.   Jacqulyn Cane, MD

## 2012-08-11 ENCOUNTER — Ambulatory Visit (INDEPENDENT_AMBULATORY_CARE_PROVIDER_SITE_OTHER): Payer: 59 | Admitting: Psychiatry

## 2012-08-11 ENCOUNTER — Encounter (HOSPITAL_COMMUNITY): Payer: Self-pay | Admitting: Psychiatry

## 2012-08-11 VITALS — BP 120/81 | HR 69 | Ht 72.0 in | Wt 219.0 lb

## 2012-08-11 DIAGNOSIS — F332 Major depressive disorder, recurrent severe without psychotic features: Secondary | ICD-10-CM

## 2012-08-11 DIAGNOSIS — F331 Major depressive disorder, recurrent, moderate: Secondary | ICD-10-CM

## 2012-08-11 MED ORDER — ESCITALOPRAM OXALATE 10 MG PO TABS: 15.0000 mg | ORAL_TABLET | Freq: Every day | ORAL | Status: DC

## 2012-08-11 NOTE — Progress Notes (Signed)
[facility] Behavioral Health Follow-up Outpatient Visit  Leonard Lucas 02-Aug-1991  Date: 08/11/2012  History of Chief Complaint :  HPI Comments: Mr. Hoose is a 21 y/o male with a past psychiatric history significant for symptoms of depression and anxiety. The patient is referred for psychiatric services for medication management.   The patient likes being at work as he gets along with everyone.  The patient reports that he has no current stressors, other than how he did on his math finals.  The patient reports that he is getting out and doing activities. The patient has not been going to gym because he didn't feel motivated to do it.  The episodes occurred twice in the past 2 weeks, and he is not sure if this was related solely to work place issues. He reports that his parents are getting along very well and Thanksgiving went well.   In the area of affective symptoms, patient appears euthymic. Patient denies current suicidal ideation, intent, or plan. Patient denies current homicidal ideation, intent, or plan. Patient denies auditory hallucinations. Patient denies visual hallucinations. Patient denies symptoms of paranoia. Patient states sleep is good, 8-10. Appetite is normal. Energy level is good. Patient denies symptoms of anhedonia. Patient denies hopelessness, helplessness, or guilt.   Denies any episodes consistent with mania, particularly decreased need for sleep with increased energy, grandiosity, impulsivity, hyperverbal and pressured speech, or increased productivity. Denies any recent symptoms consistent with psychosis, particularly auditory or visual hallucinations, thought broadcasting/insertion/withdrawal, or ideas of reference. He denies any panic attacks. Denies any history of trauma or symptoms consistent with PTSD such as flashbacks, nightmares, hypervigilance, feelings of numbness or inability to connect with others.   Review of Systems  Constitutional: Negative for change in  appetite, fever, chills, diaphoresis, activity change, fatigue and unexpected weight change.  Respiratory: Negative.  Cardiovascular: Negative. Negative for chest pain, palpitations and leg swelling.  Gastrointestinal: Negative.   Filed Vitals:   08/11/12 1120  BP: 120/81  Pulse: 69  Height: 6' (1.829 m)  Weight: 219 lb (99.338 kg)   Physical Exam  Vitals reviewed.  Constitutional: He appears well-developed and well-nourished. No distress.  Skin: He is not diaphoretic.   Past Psychiatric History: Reviewed Diagnosis: None   Hospitalizations: Patient denies.   Outpatient Care: Patient denies.   Substance Abuse Care:Patient denies.   Self-Mutilation: The patient cuts himself since age 83 periodically up until 2012   Suicidal Attempts: Patient denies.   Violent Behaviors: Patient denies.    Past Medical History: History reviewed. No pertinent past medical history.  History of Loss of Consciousness: No  Seizure History: No  Cardiac History: No   Allergies: No Known Allergies   Current Medications: Reviewed  Current Outpatient Prescriptions on File Prior to Visit  Medication Sig Dispense Refill  . escitalopram (LEXAPRO) 10 MG tablet Take 1.5 tablets (15 mg total) by mouth daily.  30 tablet  1    Previous Psychotropic Medications: Reviewed  Medication  Dose   Sertraline-started yesterday  25 mg    SUBSTANCE USE HISTORY: Reviewed  Caffeine: Caffeinated Beverages 16 ounces. Nicotine: Patient denies.  Alcohol: Patient denies.  Illicit Drugs: Patient denies.   MENTAL ILLNESS AND SUBSTANCE ABUSE IN FAMILY MEMBERS: Reviewed  Psychiatric illness: Mother -depression  Substance abuse: Mother Nicotine use.  Suicides: Patient denies.   Social History: Reviewed  Current Place of Residence: [place], [place]  Place of Birth: [place], [place]  Family Members: Lives with his father  Marital Status: Single  Children: None  Relationships:  Education: College-IT  Educational  Problems/Performance: A's; B's  Religious Beliefs/Practices: None  History of Abuse: none  Occupational Experiences: Sales associate in [employer] History: None.  Legal History: None  Hobbies/Interests: Online gaming and chatting. Going out with friends.  Family History: History reviewed. No pertinent family history.   Psychiatric Specialty examonation:  Objective: Appearance: Casual   Eye Contact:: Fair   Speech: Clear and Coherent and Normal Rate   Volume: Normal   Mood: " Pretty good" 8/10  Affect: Blunt   Thought Process: Circumstantial, Linear and Logical   Orientation: Full   Thought Content: WDL   Suicidal Thoughts: No   Homicidal Thoughts: No   Judgement: Good   Insight: Good   Psychomotor Activity: Normal   Akathisia: No   Handed: Right   AIMS (if indicated): None   Assets: Communication Skills  Desire for Improvement  Financial Resources/Insurance  Housing  Leisure Time  Physical Health  Social Support  Talents/Skills  Transportation    Laboratory/X-Ray  Psychological Evaluation(s)   None  None    Assessment:  AXIS I  Major Depression, Recurrent severe   AXIS II  No diagnosis   AXIS III  History reviewed. No pertinent past medical history.   AXIS IV  educational problems, housing problems, occupational problems and other psychosocial or environmental problems-parents are separating   AXIS V  GAF: 65   Laboratory/X-Ray  Psychological Evaluation(s)   None  None    Assessment:  AXIS I  Major Depression, Recurrent severe   AXIS II  No diagnosis   AXIS III  History reviewed. No pertinent past medical history.   AXIS IV  educational problems, housing problems, occupational problems and other psychosocial or environmental problems-parents are separating   AXIS V  51-60 moderate symptoms    Treatment Plan/Recommendations:  1. Affirm with the patient that the medications are taken as ordered. Patient expressed understanding of how their  medications were to be used. 2. Continue the following psychiatric medications as written prior to this appointment:  a) Lexapro 10 mg- one and a half tablets- total 15 mg daily.  3. Therapy: brief supportive therapy provided. Discussed psychosocial stressors in detail.  4. Risks and benefits, side effects and alternatives discussed with patient, he was given an opportunity to ask questions about his medication, illness, and treatment. All current psychiatric medications have been reviewed and discussed with the patient and adjusted as clinically appropriate. The patient has been provided an accurate and updated list of the medications being now prescribed.  5. Patient told to call clinic if any problems occur. Patient advised to go to ER if he should develop SI/HI, side effects, or if symptoms worsen. Has crisis numbers to call if needed.  6. No labs warranted at this time.  7. The patient was encouraged to keep all PCP and specialty clinic appointments.  8. Patient was instructed to return to clinic in 2 months.  9. The patient was advised to call and cancel their mental health appointment within 24 hours of the appointment, if they are unable to keep the appointment.  10. The patient expressed understanding of the plan and agrees with the above.   Jacqulyn Cane, MD

## 2012-10-12 ENCOUNTER — Ambulatory Visit (INDEPENDENT_AMBULATORY_CARE_PROVIDER_SITE_OTHER): Payer: 59 | Admitting: Psychiatry

## 2012-10-12 ENCOUNTER — Encounter (HOSPITAL_COMMUNITY): Payer: Self-pay | Admitting: Psychiatry

## 2012-10-12 VITALS — BP 118/66 | HR 68 | Ht 72.0 in | Wt 238.0 lb

## 2012-10-12 DIAGNOSIS — F331 Major depressive disorder, recurrent, moderate: Secondary | ICD-10-CM

## 2012-10-12 MED ORDER — ESCITALOPRAM OXALATE 10 MG PO TABS: 15.0000 mg | ORAL_TABLET | Freq: Every day | ORAL | Status: DC

## 2012-10-12 NOTE — Progress Notes (Signed)
[facility] Behavioral Health Follow-up Outpatient Visit  Leonard Lucas 05/08/1991  Date: 10/12/2012  History of Chief Complaint :  HPI Comments: Mr. Leonard Lucas is a 22 y/o male with a past psychiatric history significant for symptoms of depression and anxiety. The patient is referred for psychiatric services for medication management.   The patient reports he has been going twice weekly to the gym. He reports he has been doing well in school. He continues to socialize with his friends. He reports he is taking his medications daily and reports some increased appetite but denies any side effects.   In the area of affective symptoms, patient appears euthymic. Patient denies current suicidal ideation, intent, or plan. Patient denies current homicidal ideation, intent, or plan. Patient denies auditory hallucinations. Patient denies visual hallucinations. Patient denies symptoms of paranoia. Patient states sleep is good, 8-10. Appetite is normal. Energy level is good. Patient denies symptoms of anhedonia. Patient denies hopelessness, helplessness, or guilt.   Denies any episodes consistent with mania, particularly decreased need for sleep with increased energy, grandiosity, impulsivity, hyperverbal and pressured speech, or increased productivity. Denies any recent symptoms consistent with psychosis, particularly auditory or visual hallucinations, thought broadcasting/insertion/withdrawal, or ideas of reference. He denies any panic attacks. Denies any history of trauma or symptoms consistent with PTSD such as flashbacks, nightmares, hypervigilance, feelings of numbness or inability to connect with others.   Review of Systems  Constitutional: Negative for change in appetite, fever, chills, diaphoresis, activity change, fatigue and unexpected weight change.  Respiratory: Negative.  Cardiovascular: Negative. Negative for chest pain, palpitations and leg swelling.  Gastrointestinal: Negative.   Filed Vitals:   10/12/12 1507  BP: 118/66  Pulse: 68  Height: 6' (1.829 m)  Weight: 238 lb (107.956 kg)   Physical Exam  Vitals reviewed.  Constitutional: He appears well-developed and well-nourished. No distress.  Skin: He is not diaphoretic.   Past Psychiatric History: Reviewed Diagnosis: None   Hospitalizations: Patient denies.   Outpatient Care: Patient denies.   Substance Abuse Care:Patient denies.   Self-Mutilation: The patient cuts himself since age 69 periodically up until 2012   Suicidal Attempts: Patient denies.   Violent Behaviors: Patient denies.    Past Medical History: History reviewed. No pertinent past medical history.  History of Loss of Consciousness: No  Seizure History: No  Cardiac History: No   Allergies: No Known Allergies   Current Medications: Reviewed  Current Outpatient Prescriptions on File Prior to Visit  Medication Sig Dispense Refill  . escitalopram (LEXAPRO) 10 MG tablet Take 1.5 tablets (15 mg total) by mouth daily.  30 tablet  2   No current facility-administered medications on file prior to visit.    Previous Psychotropic Medications: Reviewed  Medication  Dose   Sertraline-started yesterday  25 mg    SUBSTANCE USE HISTORY: Reviewed  Caffeine: Caffeinated Beverages 16 ounces. Nicotine: Patient denies.  Alcohol: Patient denies.  Illicit Drugs: Patient denies.   MENTAL ILLNESS AND SUBSTANCE ABUSE IN FAMILY MEMBERS: Reviewed  Psychiatric illness: Mother -depression  Substance abuse: Mother Nicotine use.  Suicides: Patient denies.   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   Legal History: None  Hobbies/Interests: Online gaming  and chatting. Going out with friends.  Family History: History reviewed. No pertinent family history.   Psychiatric Specialty examonation:  Objective: Appearance: Casual   Eye Contact:: Fair   Speech: Clear and Coherent and Normal Rate   Volume: Normal   Mood: " good" 8/10  Affect: Blunt   Thought Process: Circumstantial, Linear and Logical   Orientation: Full   Thought Content: WDL   Suicidal Thoughts: No   Homicidal Thoughts: No   Judgement: Good   Insight: Good   Psychomotor Activity: Normal   Akathisia: No   Memory: 3/3 Immediate; 3/3 Recent.  Handed: Right   AIMS (if indicated): None   Assets: Communication Skills  Desire for Improvement  Financial Resources/Insurance  Housing  Leisure Time  Physical Health  Social Support  Talents/Skills  Transportation    Laboratory/X-Ray  Psychological Evaluation(s)   None  None    Assessment:  AXIS I  Major Depression, Recurrent severe   AXIS II  No diagnosis   AXIS III  History reviewed. No pertinent past medical history.   AXIS IV  educational problems, housing problems, occupational problems and other psychosocial or environmental problems-parents are separating   AXIS V  GAF: 65   Laboratory/X-Ray  Psychological Evaluation(s)   None  None    Assessment:  AXIS I  Major Depression, Recurrent severe   AXIS II  No diagnosis   AXIS III  History reviewed. No pertinent past medical history.   AXIS IV  educational problems, housing problems, occupational problems and other psychosocial or environmental problems-parents are separating   AXIS V  51-60 moderate symptoms    Treatment Plan/Recommendations:  1. Affirm with the patient that the medications are taken as ordered. Patient expressed understanding of how their medications were to be used. 2. Continue the following psychiatric medications as written prior to this appointment:  a) Lexapro 10 mg- one and a half  tablets- total 15 mg daily.  3. Therapy: brief supportive therapy provided. Discussed psychosocial stressors in detail.  4. Risks and benefits, side effects and alternatives discussed with patient, he was given an opportunity to ask questions about his medication, illness, and treatment. All current psychiatric medications have been reviewed and discussed with the patient and adjusted as clinically appropriate. The patient has been provided an accurate and updated list of the medications being now prescribed.  5. Patient told to call clinic if any problems occur. Patient advised to go to ER if he should develop SI/HI, side effects, or if symptoms worsen. Has crisis numbers to call if needed.  6. No labs warranted at this time.  7. The patient was encouraged to keep all PCP and specialty clinic appointments.  8. Patient was instructed to return to clinic in 2 months.  9. The patient was advised to call and cancel their mental health appointment within 24 hours of the appointment, if they are unable to keep the appointment.  10. The patient expressed understanding of the plan and agrees with the above.   Jacqulyn Cane, MD

## 2012-10-30 ENCOUNTER — Other Ambulatory Visit: Payer: Self-pay | Admitting: Gastroenterology

## 2012-10-30 DIAGNOSIS — R1011 Right upper quadrant pain: Secondary | ICD-10-CM

## 2012-10-31 ENCOUNTER — Ambulatory Visit (HOSPITAL_COMMUNITY)
Admission: RE | Admit: 2012-10-31 | Discharge: 2012-10-31 | Disposition: A | Discharge status: Home / Self Care | Payer: 59 | Source: Ambulatory Visit | Attending: Gastroenterology | Admitting: Gastroenterology

## 2012-10-31 DIAGNOSIS — R1011 Right upper quadrant pain: Secondary | ICD-10-CM

## 2012-10-31 DIAGNOSIS — R109 Unspecified abdominal pain: Secondary | ICD-10-CM | POA: Insufficient documentation

## 2012-11-02 ENCOUNTER — Telehealth (HOSPITAL_COMMUNITY): Payer: Self-pay | Admitting: Psychiatry

## 2012-11-22 NOTE — Telephone Encounter (Signed)
Error, please disregard.

## 2013-01-09 ENCOUNTER — Ambulatory Visit (HOSPITAL_COMMUNITY): Payer: Self-pay | Admitting: Psychiatry

## 2013-01-19 ENCOUNTER — Telehealth (HOSPITAL_COMMUNITY): Payer: Self-pay | Admitting: Psychiatry

## 2013-01-19 DIAGNOSIS — F331 Major depressive disorder, recurrent, moderate: Secondary | ICD-10-CM

## 2013-01-19 MED ORDER — ESCITALOPRAM OXALATE 10 MG PO TABS: 15.0000 mg | ORAL_TABLET | Freq: Every day | ORAL | Status: DC

## 2013-01-19 NOTE — Telephone Encounter (Signed)
Fax request for refill.  Called patient and let him know refills are available.

## 2013-06-07 ENCOUNTER — Other Ambulatory Visit (HOSPITAL_COMMUNITY): Payer: Self-pay | Admitting: Psychiatry

## 2013-06-11 ENCOUNTER — Other Ambulatory Visit (HOSPITAL_COMMUNITY): Payer: Self-pay | Admitting: Psychiatry

## 2013-06-11 DIAGNOSIS — F331 Major depressive disorder, recurrent, moderate: Secondary | ICD-10-CM

## 2013-06-11 MED ORDER — ESCITALOPRAM OXALATE 10 MG PO TABS: 15.0000 mg | ORAL_TABLET | Freq: Every day | ORAL | Status: DC

## 2013-06-18 MED ORDER — ESCITALOPRAM OXALATE 10 MG PO TABS: 15.0000 mg | ORAL_TABLET | Freq: Every day | ORAL | Status: DC

## 2013-09-18 ENCOUNTER — Ambulatory Visit (HOSPITAL_COMMUNITY): Payer: Self-pay | Admitting: Psychiatry

## 2013-10-10 ENCOUNTER — Ambulatory Visit (INDEPENDENT_AMBULATORY_CARE_PROVIDER_SITE_OTHER): Payer: 59 | Admitting: Psychiatry

## 2013-10-10 ENCOUNTER — Encounter (INDEPENDENT_AMBULATORY_CARE_PROVIDER_SITE_OTHER): Payer: Self-pay

## 2013-10-10 ENCOUNTER — Encounter (HOSPITAL_COMMUNITY): Payer: Self-pay | Admitting: Psychiatry

## 2013-10-10 VITALS — BP 109/66 | HR 83 | Wt 228.0 lb

## 2013-10-10 DIAGNOSIS — F332 Major depressive disorder, recurrent severe without psychotic features: Secondary | ICD-10-CM

## 2013-10-10 DIAGNOSIS — F429 Obsessive-compulsive disorder, unspecified: Secondary | ICD-10-CM

## 2013-10-10 MED ORDER — FLUOXETINE HCL 10 MG PO CAPS: 10.0000 mg | ORAL_CAPSULE | Freq: Every day | ORAL | Status: DC

## 2013-10-10 MED ORDER — ESCITALOPRAM OXALATE 10 MG PO TABS: ORAL_TABLET | ORAL | Status: DC

## 2013-10-10 NOTE — Progress Notes (Signed)
[facility] Follow-up Outpatient Visit  Leonard Lucas 1991/04/14  Date: 10/10/2013  History of Chief Complaint  HPI Comments: Leonard Lucas is a 23 y/o male with a past psychiatric history significant for symptoms of depression and anxiety. The patient is referred for psychiatric services for medication management.   Leonard Lucas patient reports continued anxiety. He reports feeling Lexapro has not helped though he increased his dose to 20 mg (without informing the provider.)  . Quality: The patient reports he has been going twice weekly to the gym. He reports he has been doing well in school. He continues to socialize with his friends. 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  He states he is very negative towards himself and severely critical of his own work and progress.He reports he is taking his medications daily and reports some increased appetite but denies any side effects.   In the area of affective symptoms, patient appears mildly depressed. Patient denies current suicidal ideation, intent, or plan. Patient denies current homicidal ideation, intent, or plan. Patient denies auditory hallucinations. Patient denies visual hallucinations. Patient denies symptoms of paranoia. Patient states sleep is non-restorative, despite 10-12 hours of sleep a day. Appetite is normal. Energy level is low. Patient denies symptoms of anhedonia. Patient denies guilt, but endorses hopelessness, helplessness.   . Severity: Depression: 3/10 (0=Very depressed; 5=Neutral; 10=Very Happy)  Anxiety- 7-8/10 (0=no anxiety; 5= moderate/tolerable anxiety; 10= panic attacks)  . Duration; Anxiety-since middle school. Recognized about 2 years ago  . Timing: Throughout the day, mostly days that he has work.  . Context: Obsessing about annoying a person, not being good at his work.  . Modifying factors: Mood improves with not quitting.  . Associated signs and  symptoms : Denies any episodes consistent with mania, particularly decreased need for sleep with increased energy, grandiosity, impulsivity, hyperverbal and pressured speech, or increased productivity. Denies any recent symptoms consistent with psychosis, particularly auditory or visual hallucinations, thought broadcasting/insertion/withdrawal, or ideas of reference. He denies any panic attacks but endorses continued anxiety. Denies any history of trauma or symptoms consistent with PTSD such as flashbacks, nightmares, hypervigilance, feelings of numbness or inability to connect with others.    Review of Systems  Constitutional: Negative for change in appetite, fever, chills, diaphoresis, activity change, fatigue and unexpected weight change.  Respiratory: Negative.  Cardiovascular: Negative. Negative for chest pain, palpitations and leg swelling.  Gastrointestinal: Negative.   Filed Vitals:   10/10/13 1135  BP: 109/66  Pulse: 83  Weight: 228 lb (103.42 kg)   Physical Exam  Vitals reviewed.  Constitutional: He appears well-developed and well-nourished. No distress.  Skin: He is not diaphoretic. Musculoskeletal: Gait & Station: normal Patient leans: N/A   Past Psychiatric History: Reviewed Diagnosis: None   Hospitalizations: Patient denies.   Outpatient Care: Patient denies.   Substance Abuse Care:Patient denies.   Self-Mutilation: The patient cuts himself since age 22 periodically up until 2012   Suicidal Attempts: Patient denies.   Violent Behaviors: Patient denies.    Past Medical History: History reviewed. No pertinent past medical history.  History of Loss of Consciousness: No  Seizure History: No  Cardiac History: No   Allergies: No Known Allergies   Current Medications: Reviewed  Current Outpatient Prescriptions on File Prior to Visit  Medication Sig Dispense Refill  . escitalopram (LEXAPRO) 10 MG tablet Take 1.5 tablets (15 mg total) by mouth daily.  45 tablet  1  .  Loratadine 10 MG CAPS Take 10 mg by mouth.       No current facility-administered medications on file prior to visit.    Previous Psychotropic Medications: Reviewed  Medication  Dose   Prozac-   Sertraline-started yesterday  25 mg    SUBSTANCE USE HISTORY:  History   Social History  . Marital Status: Single    Spouse Name: N/A    Number of Children: N/A  . Years of Education: N/A   Occupational History  . Not on file.   Social History Main Topics  . Smoking status: Never Smoker   . Smokeless tobacco: Not on file  . Alcohol Use: No  . Drug Use: No  . Sexual Activity: No   Other Topics Concern  . Not on file   Social History Narrative  . No narrative on file     MENTAL ILLNESS AND SUBSTANCE ABUSE IN FAMILY MEMBERS: Reviewed  Psychiatric illness: Mother -depression  Substance abuse: Mother Nicotine use.  Suicides: Patient denies.   Social History: Reviewed  Current Place of Residence: [place], [place] of Birth: [place], [place]  Family Members: Lives with his father and brother Marital Status: Single  Children: None  Relationships: Some close friends. Regards himself as bisexual. 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  Legal History: None  Hobbies/Interests: Online gaming and chatting. Going out with friends.  Family History: History reviewed. No pertinent family history.   Psychiatric Specialty examination: General Appearance: Casual and Well Groomed  Eye Contact::  Good  Speech:  Clear and Coherent and Normal Rate  Volume:  Normal  Mood:  "Below baseline"  Affect:  Appropriate, Congruent and Full Range  Thought Process:  Coherent, Linear and Logical  Orientation:  Full (Time, Place, and Person)  Thought Content:  WDL  Suicidal Thoughts:  No  Homicidal Thoughts:  No  Memory:   Immediate;   Good Recent;   Good Remote;   Good  Judgement:  Fair  Insight:  Fair  Psychomotor Activity:  Normal  Concentration:  Good  Recall:  Good  Akathisia:  No  Handed:  Right  AIMS (if indicated): No  Assets:  Communication Skills Desire for Improvement Financial Resources/Insurance Housing Leisure Time [place] Talents/Skills Transportation Vocational/Educational   language intact and comprehensive    fund of knowledge is good      Laboratory/X-Ray  Psychological Evaluation(s)   None  None     Laboratory/X-Ray  Psychological Evaluation(s)   None  None    Assessment:  AXIS I  Major Depression, Recurrent severe-unimproved  AXIS II  No diagnosis   AXIS III  History reviewed. No pertinent past medical history.   AXIS IV  educational problems, housing problems, occupational problems and other psychosocial or environmental problems-parents are separating   AXIS V  51-60 moderate symptoms    Treatment Plan/Recommendations:  1. Affirm with the patient that the medications are taken as ordered. Patient expressed understanding of how their medications were to be used. 2. Continue the following psychiatric medications as written prior to this appointment:  a) Prozac-10 mg Will consider increase in 4 weeks. B) Taper lexapro over 4 weeks-Take two tablets for 7 days, then 1.5 tablets for 7 days, then 1 tablet for 7 days, then a half tablet daily for 7 days, then stop 3. Therapy: brief supportive therapy provided. Discussed  psychosocial stressors in detail. More than 50% of the visit was spent on individual therapy/counseling. 4. Risks and benefits, side effects and alternatives discussed with patient, he was given an opportunity to ask questions about his medication, illness, and treatment. All current psychiatric medications have been reviewed and discussed with the patient and adjusted as clinically appropriate. The patient has been provided  an accurate and updated list of the medications being now prescribed.  5. Patient told to call clinic if any problems occur. Patient advised to go to ER if he should develop SI/HI, side effects, or if symptoms worsen. Has crisis numbers to call if needed.  6. No labs warranted at this time.  7. The patient was encouraged to keep all PCP and specialty clinic appointments.  8. Patient was instructed to return to clinic in 2 months.  9. The patient was advised to call and cancel their mental health appointment within 24 hours of the appointment, if they are unable to keep the appointment.  10. The patient expressed understanding of the plan and agrees with the above.  11. Patient informed that April 15th, 2015 would be my last day at this clinic.  Time spent: 25 minutes Liam Bossman, Amedeo Gory, MD

## 2013-11-07 ENCOUNTER — Encounter: Payer: Self-pay | Admitting: Physician Assistant

## 2013-11-09 ENCOUNTER — Telehealth (HOSPITAL_COMMUNITY): Payer: Self-pay

## 2013-11-09 ENCOUNTER — Other Ambulatory Visit (HOSPITAL_COMMUNITY): Payer: Self-pay | Admitting: Psychiatry

## 2013-11-09 MED ORDER — FLUOXETINE HCL 20 MG PO CAPS: 20.0000 mg | ORAL_CAPSULE | Freq: Every day | ORAL | Status: DC

## 2013-11-09 NOTE — Telephone Encounter (Signed)
Refill request for fluoxetine approved.

## 2013-11-09 NOTE — Telephone Encounter (Signed)
Called patient. Increased prozac to 20 mg daily.

## 2013-11-21 ENCOUNTER — Ambulatory Visit (HOSPITAL_COMMUNITY): Payer: Self-pay | Admitting: Psychiatry

## 2014-01-11 ENCOUNTER — Ambulatory Visit (INDEPENDENT_AMBULATORY_CARE_PROVIDER_SITE_OTHER): Payer: 59 | Admitting: Psychiatry

## 2014-01-11 DIAGNOSIS — F332 Major depressive disorder, recurrent severe without psychotic features: Secondary | ICD-10-CM

## 2014-01-11 DIAGNOSIS — F331 Major depressive disorder, recurrent, moderate: Secondary | ICD-10-CM

## 2014-01-11 MED ORDER — FLUOXETINE HCL 10 MG PO CAPS: 10.0000 mg | ORAL_CAPSULE | Freq: Every day | ORAL | Status: DC

## 2014-01-11 MED ORDER — FLUOXETINE HCL 20 MG PO CAPS
20.0000 mg | ORAL_CAPSULE | Freq: Every day | ORAL | Status: DC
Start: 2014-01-11 — End: 2014-01-11

## 2014-01-11 MED ORDER — FLUOXETINE HCL 20 MG PO CAPS: 20.0000 mg | ORAL_CAPSULE | Freq: Every day | ORAL | Status: DC

## 2014-01-11 NOTE — Progress Notes (Signed)
Patient ID: Leonard Lucas, male   DOB: 12/06/1990, 23 y.o.   MRN: [id number]   [facility] Follow-up Outpatient Visit  Halden Phegley 1990-11-15  Date: 01/11/2014  History of Chief Complaint  HPI Comments: Mr. Leonard Lucas is a 23 y/o male with a past psychiatric history significant for symptoms of depression and anxiety. The patient is referred for psychiatric services for medication management.   Leonard Lucas patient reports continued anxiety. He reports feeling prozac has helped and he feels better. Still comlains of some depression and worries and wants to increase the dose.  . Quality: The patient reports he has been going twice weekly to the gym. He reports he has been doing well in school. He continues to socialize with his friends. 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  He states he is very negative towards himself and severely critical of his own work and progress.He reports he is taking his medications daily and reports some increased appetite but denies any side effects.   In the area of affective symptoms, patient appears mildly depressed. Patient denies current suicidal ideation, intent, or plan. Patient denies current homicidal ideation, intent, or plan. Patient denies auditory hallucinations. Patient denies visual hallucinations. Patient denies symptoms of paranoia. Patient states sleep is non-restorative, despite 10-12 hours of sleep a day. Appetite is normal. Energy level is low. Patient denies symptoms of anhedonia. Patient denies guilt, but endorses hopelessness, helplessness.   . Severity: Depression: 6/10 (0=Very depressed; 5=Neutral; 10=Very Happy)  Anxiety- 7-8/10 (0=no anxiety; 5= moderate/tolerable anxiety; 10= panic attacks)  . Duration; Anxiety-since middle school. Recognized about 2 years ago  . Timing: Throughout the day, mostly days that he has work.  . Context: Obsessing about annoying a person, not being good at  his work.  . Modifying factors: Mood improves with not quitting.  . Associated signs and symptoms : Denies any episodes consistent with mania, particularly decreased need for sleep with increased energy, grandiosity, impulsivity, hyperverbal and pressured speech, or increased productivity. Denies any recent symptoms consistent with psychosis, particularly auditory or visual hallucinations, thought broadcasting/insertion/withdrawal, or ideas of reference. He denies any panic attacks but endorses continued anxiety. Denies any history of trauma or symptoms consistent with PTSD such as flashbacks, nightmares, hypervigilance, feelings of numbness or inability to connect with others.    Review of Systems  Constitutional: Negative for change in appetite, fever, chills, diaphoresis, activity change, fatigue and unexpected weight change.  Respiratory: Negative.  Cardiovascular: Negative. Negative for chest pain, palpitations and leg swelling.  Gastrointestinal: Negative.   There were no vitals filed for this visit. Physical Exam  Vitals reviewed.  Constitutional: He appears well-developed and well-nourished. No distress.  Skin: He is not diaphoretic. Musculoskeletal: Gait & Station: normal Patient leans: N/A   Past Psychiatric History: Reviewed Diagnosis: None   Hospitalizations: Patient denies.   Outpatient Care: Patient denies.   Substance Abuse Care:Patient denies.   Self-Mutilation: The patient cuts himself since age 74 periodically up until 2012   Suicidal Attempts: Patient denies.   Violent Behaviors: Patient denies.    Past Medical History: History reviewed. No pertinent past medical history.  History of Loss of Consciousness: No  Seizure History: No  Cardiac History: No   Allergies: No Known Allergies   Current Medications: Reviewed  Current Outpatient Prescriptions on File Prior to Visit  Medication Sig Dispense Refill  . Loratadine 10 MG CAPS Take 10 mg by mouth.  No current facility-administered medications on file prior to visit.    Previous Psychotropic Medications: Reviewed  Medication  Dose   Prozac-   Sertraline-started yesterday  25 mg    SUBSTANCE USE HISTORY:  History   Social History  . Marital Status: Single    Spouse Name: N/A    Number of Children: N/A  . Years of Education: N/A   Occupational History  . Not on file.   Social History Main Topics  . Smoking status: Never Smoker   . Smokeless tobacco: Not on file  . Alcohol Use: No  . Drug Use: No  . Sexual Activity: No   Other Topics Concern  . Not on file   Social History Narrative  . No narrative on file     MENTAL ILLNESS AND SUBSTANCE ABUSE IN FAMILY MEMBERS: Reviewed  Psychiatric illness: Mother -depression  Substance abuse: Mother Nicotine use.  Suicides: Patient denies.   Social History: Reviewed  Current Place of Residence: [place], [place] of Birth: [place], [place]  Family Members: Lives with his father and brother Marital Status: Single  Children: None  Relationships: Some close friends. Regards himself as bisexual. 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  Legal History: None  Hobbies/Interests: Online gaming and chatting. Going out with friends.  Family History: History reviewed. No pertinent family history.   Psychiatric Specialty examination: General Appearance: Casual and Well Groomed  Eye Contact::  Good  Speech:  Clear and Coherent and Normal Rate  Volume:  Normal  Mood:  "Below baseline"  Affect:  Appropriate, Congruent and Full Range  Thought Process:  Coherent, Linear and Logical  Orientation:  Full (Time, Place, and Person)  Thought Content:  WDL  Suicidal Thoughts:  No  Homicidal Thoughts:  No  Memory:  Immediate;   Good Recent;   Good Remote;    Good  Judgement:  Fair  Insight:  Fair  Psychomotor Activity:  Normal  Concentration:  Good  Recall:  Good  Akathisia:  No  Handed:  Right  AIMS (if indicated): No  Assets:  Communication Skills Desire for Improvement Financial Resources/Insurance Housing Leisure Time [place] Talents/Skills Transportation Vocational/Educational   language intact and comprehensive    fund of knowledge is good      Laboratory/X-Ray  Psychological Evaluation(s)   None  None     Laboratory/X-Ray  Psychological Evaluation(s)   None  None    Assessment:  AXIS I  Major Depression, Recurrent severe-unimproved  AXIS II  No diagnosis   AXIS III  History reviewed. No pertinent past medical history.   AXIS IV  educational problems, housing problems, occupational problems and other psychosocial or environmental problems-parents are separating   AXIS V  51-60 moderate symptoms    Treatment Plan/Recommendations:  1. Affirm with the patient that the medications are taken as ordered. Patient expressed understanding of how their medications were to be used. 2. Continue the following psychiatric medications as written prior to this appointment:  a) Prozac-20mg . Will increase to 30mg . Patient informed to cut dose down or call for any concern of side effects. 3. Therapy: brief supportive therapy provided. Discussed psychosocial stressors in detail. More than 50% of the visit was spent on individual therapy/counseling. 4. Risks and benefits, side effects and alternatives discussed with patient, he was given an opportunity to ask questions about his medication,  illness, and treatment. All current psychiatric medications have been reviewed and discussed with the patient and adjusted as clinically appropriate. The patient has been provided an accurate and updated list of the medications being now prescribed.  5. Patient told to call clinic if any problems occur. Patient advised  to go to ER if he should develop SI/HI, side effects, or if symptoms worsen. Has crisis numbers to call if needed.  6. No labs warranted at this time.  7. The patient was encouraged to keep all PCP and specialty clinic appointments.  8. Patient was instructed to return to clinic in 2 months.  9. The patient was advised to call and cancel their mental health appointment within 24 hours of the appointment, if they are unable to keep the appointment.  10. The patient expressed understanding of the plan and agrees with the above.    Time spent: 25 minutes [name], MD

## 2014-03-09 ENCOUNTER — Encounter: Payer: Self-pay | Admitting: Emergency Medicine

## 2014-03-09 ENCOUNTER — Emergency Department
Admission: EM | Admit: 2014-03-09 | Discharge: 2014-03-09 | Disposition: A | Discharge status: Home / Self Care | Payer: 59 | Source: Home / Self Care | Attending: Emergency Medicine | Admitting: Emergency Medicine

## 2014-03-09 DIAGNOSIS — K2900 Acute gastritis without bleeding: Secondary | ICD-10-CM

## 2014-03-09 DIAGNOSIS — K219 Gastro-esophageal reflux disease without esophagitis: Secondary | ICD-10-CM

## 2014-03-09 MED ORDER — FAMOTIDINE 20 MG PO TABS: 20.0000 mg | ORAL_TABLET | Freq: Two times a day (BID) | ORAL | Status: DC

## 2014-03-09 NOTE — ED Provider Notes (Signed)
CSN: [id number]     Arrival date & time 03/09/14  5465 History   First MD Initiated Contact with Patient 03/09/14 [phone]     Chief Complaint  Patient presents with  . Abdominal Pain  . Emesis    Patient is a 23 y.o. male presenting with abdominal pain. The history is provided by the patient.  Abdominal Pain This is a new problem. Episode onset: 5 days. Episode frequency: Episodic. The problem has not changed since onset.Associated symptoms include abdominal pain. Pertinent negatives include no chest pain, no headaches and no shortness of breath. Exacerbated by: Sometimes hunger. Relieved by: Sometimes eating food, but he's not certain. He has tried nothing for the symptoms.   Complains of 5 days of episodic dull epigastric chest discomfort 4/10 in intensity without radiation. Denies nausea. Very early this morning, he awoke, had a brief feeling of nausea, then vomited times one without any blood or dark colored material or bile. Since then, nausea and vomiting have resolved. Yesterday, had 2 semi-formed stools without blood or melena or mucus, but today had normal BM. He feels that symptoms could be from situational anxiety. He feels it might be from "heartburn", as he was told he may have heartburn/reflux symptoms in the past.--In the past, took a "acid blocker" which helped the heartburn at that time. Denies chest pain or shortness of breath. No fever or chills. No HEENT symptoms. Denies focal neurologic symptoms . No personal or family history of heart disease.  He was able to eat small amount of food an hour ago without problems. History reviewed. No pertinent past medical history. Past Surgical History  Procedure Laterality Date  . Wisdom tooth extraction  08/2010   Family History  Problem Relation Age of Onset  . Breast cancer Mother   . Depression Mother   . Depression Paternal Aunt    History  Substance Use Topics  . Smoking status: Never Smoker   . Smokeless tobacco: Not on  file  . Alcohol Use: No    Review of Systems  Respiratory: Negative for shortness of breath.   Cardiovascular: Negative for chest pain.  Gastrointestinal: Positive for abdominal pain.  Neurological: Negative for headaches.  Psychiatric/Behavioral: Negative for suicidal ideas and hallucinations.  All other systems reviewed and are negative.   Allergies  Review of patient's allergies indicates no known allergies.  Home Medications   Prior to Admission medications   Medication Sig Start Date End Date Taking? Authorizing Provider  FLUoxetine (PROZAC) 10 MG capsule Take 1 capsule (10 mg total) by mouth daily. 01/11/14 01/11/15 Yes [name], MD  FLUoxetine (PROZAC) 20 MG capsule Take 1 capsule (20 mg total) by mouth daily. 01/11/14 01/11/15 Yes [name], MD  Loratadine 10 MG CAPS Take 10 mg by mouth.   Yes Historical Provider, MD  famotidine (PEPCID) 20 MG tablet Take 1 tablet (20 mg total) by mouth 2 (two) times daily. 03/09/14   Jacqulyn Cane, MD   BP 128/76  Pulse 80  Temp(Src) 98.6 F (37 C) (Oral)  Ht 6' (1.829 m)  Wt 217 lb (98.431 kg)  BMI 29.42 kg/m2  SpO2 100% Physical Exam  Nursing note and vitals reviewed. Constitutional: He is oriented to person, place, and time. He appears well-developed and well-nourished. No distress.  Cooperative, pleasant 23 year old male. He is slightly anxious, but no acute distress.  HENT:  Head: Normocephalic and atraumatic.  Nose: Nose normal.  Mouth/Throat: Oropharynx is clear and moist. No oropharyngeal exudate.  Eyes: Conjunctivae and  EOM are normal. Pupils are equal, round, and reactive to light. Right eye exhibits no discharge. Left eye exhibits no discharge. No scleral icterus.  Neck: Normal range of motion. Neck supple. No JVD present. No tracheal deviation present.  Cardiovascular: Normal rate and normal heart sounds.  Exam reveals no gallop and no friction rub.   No murmur heard. Pulmonary/Chest: Effort normal. No stridor.   Abdominal: Soft. Bowel sounds are normal. He exhibits no distension and no mass. There is no hepatosplenomegaly. There is tenderness (Minimal epigastric tenderness). There is no rigidity, no rebound, no guarding, no CVA tenderness, no tenderness at McBurney's point and negative Murphy's sign.  Musculoskeletal: Normal range of motion.  Lymphadenopathy:    He has no cervical adenopathy.  Neurological: He is alert and oriented to person, place, and time. No cranial nerve deficit. Gait normal.  Skin: Skin is warm. No rash noted. He is not diaphoretic.  Psychiatric: He has a normal mood and affect.    ED Course  Procedures (including critical care time) Labs Review Labs Reviewed - No data to display  Imaging Review No results found.   MDM   1. Acute gastritis without hemorrhage   2. Gastroesophageal reflux disease, esophagitis presence not specified    also in the differential could be gallbladder disease or other GI causes, but based on history and physical exam, these are less likely. Clinically, no evidence of acute abdomen. Testing options discussed, and he declined any lab work or imaging.  Over 30 minutes spent, greater than 50% of the time spent for counseling and coordination of care.  Treatment options discussed, as well as risks, benefits, alternatives. Patient voiced understanding and agreement with the following plans: Pepcid 20 mg daily twice a day. Bland diet discussed and printout and written information given. Reflux diet given . Follow-up with your primary care doctor in 5-7 days if not improving, or sooner if symptoms become worse. Precautions discussed. Red flags discussed. Questions invited and answered. Patient voiced understanding and agreement.    Jacqulyn Cane, MD 03/10/14 [phone]

## 2014-03-09 NOTE — ED Notes (Signed)
Pierson complains of body aches, stomach pain, vomiting(once) and slight chest pain. He does report some heart burn. He has woke up in the middle of the night with stomach pain, it is just below his rib cage.  Denies fever, chills or sweats. Denies personal or family history of heart disease.

## 2014-03-12 ENCOUNTER — Ambulatory Visit (HOSPITAL_COMMUNITY): Payer: Self-pay | Admitting: Psychiatry

## 2014-03-13 ENCOUNTER — Other Ambulatory Visit (HOSPITAL_COMMUNITY): Payer: Self-pay | Admitting: Psychiatry

## 2014-03-13 DIAGNOSIS — F329 Major depressive disorder, single episode, unspecified: Secondary | ICD-10-CM

## 2014-03-13 DIAGNOSIS — F3289 Other specified depressive episodes: Secondary | ICD-10-CM

## 2014-03-15 MED ORDER — FLUOXETINE HCL 20 MG PO CAPS: 20.0000 mg | ORAL_CAPSULE | Freq: Every day | ORAL | Status: DC

## 2014-03-15 NOTE — Telephone Encounter (Signed)
Med refill sent to pharmacy for prozac.

## 2014-03-21 ENCOUNTER — Ambulatory Visit (INDEPENDENT_AMBULATORY_CARE_PROVIDER_SITE_OTHER): Payer: 59 | Admitting: Psychiatry

## 2014-03-21 DIAGNOSIS — F331 Major depressive disorder, recurrent, moderate: Secondary | ICD-10-CM

## 2014-03-21 MED ORDER — FLUOXETINE HCL 20 MG PO CAPS: 20.0000 mg | ORAL_CAPSULE | Freq: Every day | ORAL | Status: DC

## 2014-03-21 MED ORDER — FLUOXETINE HCL 10 MG PO CAPS: 10.0000 mg | ORAL_CAPSULE | Freq: Every day | ORAL | Status: DC

## 2014-03-21 NOTE — Progress Notes (Signed)
Patient ID: Selim Durden, male   DOB: 09/02/1990, 23 y.o.   MRN: [id number] [facility] Follow-up Outpatient Visit  Steadman Prosperi [id number] 22 y.o.  03/21/2014  Chief Complaint: depression follow up     History of Present Illness:    Mr. Cabello is a 23 y/o male with a past psychiatric history significant for symptoms of depression and anxiety. The patient is referred for psychiatric services for medication management.  [name] patient reports less anxiety since his prozac was increased last visit. He ran shor 5 days ago and is feeling somewhat down again and recurrence of anxiety.  He tolerated prozac reasonable and felt comfortable with its effect . Quality:  The patient reports he has been going twice weekly to the gym. He reports he has been doing well in school. He continues to socialize with his friends. 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 He states he is not very egative towards himself and self image has improved.  .  In the area of affective symptoms, patient appears mildly depressed. Patient denies current suicidal ideation, intent, or plan. Patient denies current homicidal ideation, intent, or plan. Patient denies auditory hallucinations. Patient denies visual hallucinations. Patient denies symptoms of paranoia. Patient states sleep is non-restorative, despite 10-12 hours of sleep a day. Appetite is normal. Energy level is low. Patient denies symptoms of anhedonia. Patient denies guilt, but endorses hopelessness, helplessness.  . Severity:  Depression: 7/10 (0=Very depressed; 5=Neutral; 10=Very Happy)  Anxiety- 4/10 (0=no anxiety; 5= moderate/tolerable anxiety; 10= panic attacks)  . Duration;  Anxiety-since middle school. Recognized about 2 years ago  . Timing: Throughout the day, mostly days that he has work.  . Context: Obsessing about annoying a person, not being good at his work at times. But less critical of himself  now.  . Modifying factors: Mood improves with not quitting.   No past medical history on file. Family History  Problem Relation Age of Onset  . Breast cancer Mother   . Depression Mother   . Depression Paternal Aunt     Outpatient Encounter Prescriptions as of 03/21/2014  Medication Sig  . famotidine (PEPCID) 20 MG tablet Take 1 tablet (20 mg total) by mouth 2 (two) times daily.  Marland Kitchen FLUoxetine (PROZAC) 10 MG capsule Take 1 capsule (10 mg total) by mouth daily.  Marland Kitchen FLUoxetine (PROZAC) 20 MG capsule Take 1 capsule (20 mg total) by mouth daily.  . Loratadine 10 MG CAPS Take 10 mg by mouth.  . [DISCONTINUED] FLUoxetine (PROZAC) 10 MG capsule Take 1 capsule (10 mg total) by mouth daily.  . [DISCONTINUED] FLUoxetine (PROZAC) 20 MG capsule Take 1 capsule (20 mg total) by mouth daily.    No results found for this or any previous visit (from the past 2160 hour(s)).  There were no vitals taken for this visit.   Review of Systems  Constitutional: Negative.   Gastrointestinal: Negative for nausea and vomiting.  Neurological: Negative for dizziness.  Psychiatric/Behavioral: Positive for depression. Negative for suicidal ideas, hallucinations and substance abuse.    Mental Status Examination  Appearance: casual' Alert: Yes Attention: fair  Cooperative: Yes Eye Contact: Fair Speech: normal tone Psychomotor Activity: Decreased Memory/Concentration: adequate  Oriented: person, place and time/date Mood: Euthymic Affect: Congruent Thought Processes and Associations: Coherent Fund of Knowledge: Fair Thought Content: Suicidal ideation and Homicidal ideation were denied Insight: Fair Judgement: Fair  Diagnosis: Maj. depressive disorder recurrent moderate. Obsessive-compulsive traits  Treatment Plan:  Continue Prozac now at a dose of 30 mg he does feel better with this higher dose. There's no reported side effects. Sleep energy has improved less anxious.  Pertinent Labs and Relevant  Prior Notes reviewed. Medication Side effects, benefits and risks reviewed/discussed with Patient. Time given for patient to respond and asks questions regarding the Diagnosis and Medications. Safety concerns and to report to ER if suicidal or call 911. Relevant Medications refilled or called in to pharmacy. Discussed weight maintenance and Sleep Hygiene. Follow up with Primary care provider in regards to Medical conditions. Recommend compliance with medications and follow up office appointments. Discussed to avail opportunity to consider or/and continue Individual therapy with Counselor. Greater than 50% of time was spend in counseling and coordination of care with the patient.  Schedule for Follow up visit in 3 months or call in earlier as necessary.  [name], MD 03/21/2014

## 2014-06-21 ENCOUNTER — Ambulatory Visit (INDEPENDENT_AMBULATORY_CARE_PROVIDER_SITE_OTHER): Payer: 59 | Admitting: Psychiatry

## 2014-06-21 DIAGNOSIS — F42 Obsessive-compulsive disorder: Secondary | ICD-10-CM

## 2014-06-21 DIAGNOSIS — F331 Major depressive disorder, recurrent, moderate: Secondary | ICD-10-CM

## 2014-06-21 DIAGNOSIS — F429 Obsessive-compulsive disorder, unspecified: Secondary | ICD-10-CM

## 2014-06-21 MED ORDER — FLUOXETINE HCL 10 MG PO CAPS: 10.0000 mg | ORAL_CAPSULE | Freq: Every day | ORAL | Status: DC

## 2014-06-21 MED ORDER — FLUOXETINE HCL 20 MG PO CAPS: 20.0000 mg | ORAL_CAPSULE | Freq: Every day | ORAL | Status: DC

## 2014-06-21 NOTE — Progress Notes (Signed)
Patient ID: Leonard Lucas, male   DOB: November 14, 1990, 23 y.o.   MRN: [id number] [facility] Follow-up Outpatient Visit  Leonard Lucas [id number] 22 y.o.  06/21/2014  Chief Complaint: depression follow up     History of Present Illness:    Mr. Nebel is a 23 y/o male with a past psychiatric history significant for symptoms of depression and anxiety. The patient is referred for psychiatric services for medication management.   [name] patient reports anxiety manageable when he is on his meds. Still has low confidence issue and difficult relationship with his Dad. Medication prozac helps some with his social anxiety.  He tolerated prozac reasonable and felt comfortable with its effect . Quality:  The patient reports he has been going twice weekly to the gym. He reports he has been doing well in school. He continues to socialize with his friends. 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 He states he is not very egative towards himself and self image has improved.  .  In the area of affective symptoms, patient appears mildly depressed. Patient denies current suicidal ideation, intent, or plan. Patient denies current homicidal ideation, intent, or plan. Patient denies auditory hallucinations. Patient denies visual hallucinations. Patient denies symptoms of paranoia. Patient states sleep is non-restorative, despite 10-12 hours of sleep a day. Appetite is normal. Energy level is low. Patient denies symptoms of anhedonia. Patient denies guilt, but endorses hopelessness, helplessness.  . Severity:  Depression: 7/10 (0=Very depressed; 5=Neutral; 10=Very Happy)  Anxiety- 4/10 (0=no anxiety; 5= moderate/tolerable anxiety; 10= panic attacks)  . Duration;  Anxiety-since middle school. Recognized about 2 years ago  . Timing: Throughout the day, mostly days that he has work.  . Context: Obsessing about annoying a person, not being good at his work at times.  But less critical of himself now.  . Modifying factors: Mood improves with not quitting.   No past medical history on file. Family History  Problem Relation Age of Onset  . Breast cancer Mother   . Depression Mother   . Depression Paternal Aunt     Outpatient Encounter Prescriptions as of 06/21/2014  Medication Sig  . famotidine (PEPCID) 20 MG tablet Take 1 tablet (20 mg total) by mouth 2 (two) times daily.  Marland Kitchen FLUoxetine (PROZAC) 10 MG capsule Take 1 capsule (10 mg total) by mouth daily.  Marland Kitchen FLUoxetine (PROZAC) 20 MG capsule Take 1 capsule (20 mg total) by mouth daily.  . Loratadine 10 MG CAPS Take 10 mg by mouth.  . [DISCONTINUED] FLUoxetine (PROZAC) 10 MG capsule Take 1 capsule (10 mg total) by mouth daily.  . [DISCONTINUED] FLUoxetine (PROZAC) 20 MG capsule Take 1 capsule (20 mg total) by mouth daily.    No results found for this or any previous visit (from the past 2160 hour(s)).  There were no vitals taken for this visit.   Review of Systems  Constitutional: Negative for fever.  Cardiovascular: Negative for chest pain.  Gastrointestinal: Negative for nausea.  Musculoskeletal: Negative for myalgias.  Neurological: Negative for dizziness and headaches.  Psychiatric/Behavioral: Positive for depression. Negative for suicidal ideas, hallucinations and substance abuse.    Mental Status Examination  Appearance: casual' Alert: Yes Attention: fair  Cooperative: Yes Eye Contact: Fair Speech: normal tone Psychomotor Activity: Decreased Memory/Concentration: adequate  Oriented: person, place and time/date Mood: Euthymic Affect: Congruent Thought Processes and Associations: Coherent Fund of Knowledge: Fair Thought Content: Suicidal ideation and Homicidal ideation were denied Insight:  Fair Judgement: Fair  Diagnosis: Maj. depressive disorder recurrent moderate. Obsessive-compulsive traits  Treatment Plan:   Continue Prozac 30 mg he does feel better with this higher  dose. There's no reported side effects. Sleep energy has improved less anxious. I recommend he joins counselling to build his self confidence and he has agree to it.   Pertinent Labs and Relevant Prior Notes reviewed. Medication Side effects, benefits and risks reviewed/discussed with Patient. Time given for patient to respond and asks questions regarding the Diagnosis and Medications. Safety concerns and to report to ER if suicidal or call 911. Relevant Medications refilled or called in to pharmacy. Discussed weight maintenance and Sleep Hygiene. Follow up with Primary care provider in regards to Medical conditions. Recommend compliance with medications and follow up office appointments. Discussed to avail opportunity to consider or/and continue Individual therapy with Counselor. Greater than 50% of time was spend in counseling and coordination of care with the patient.  Schedule for Follow up visit in 2 months or call in earlier as necessary.  [name], MD 06/21/2014

## 2014-07-02 ENCOUNTER — Ambulatory Visit (INDEPENDENT_AMBULATORY_CARE_PROVIDER_SITE_OTHER): Payer: 59 | Admitting: Licensed Clinical Social Worker

## 2014-07-02 DIAGNOSIS — F4010 Social phobia, unspecified: Secondary | ICD-10-CM

## 2014-07-02 DIAGNOSIS — F331 Major depressive disorder, recurrent, moderate: Secondary | ICD-10-CM

## 2014-07-02 NOTE — Progress Notes (Signed)
THERAPIST PROGRESS NOTE      Leonard Lucas 07/02/2014 Date: 07/02/2014 Session Time:  11:00am-12:00pm Referral Source: Psychiatrist, Dr Danton Sewer  PRESENTING PROBLEM  Chief Complaint:  Anxiety, poor self confidence What are the main stressors in your life right now, how long? Interested in drawing but lacks confidence with it, a general lack of energy, living with dad who he describes as "overbearing," doesn't enjoy his job at [employer]  Have you ever been treated for a mental health problem, when, where, by whom? Yes, currently seeing Dr De Nurse at [facility], estimates he has been getting psychiatric services for 2 years Are you currently seeing a therapist or counselor, counselor's name? No Have you ever had a mental health hospitalization, how many times, length of stay? No Have you ever been treated with medication, name, reason, response? Yes  Prozac 30mg  daily  For anxiety  "It has done its job pretty well."  Have you ever had suicidal thoughts or attempted suicide, when, how? Some suicidal thoughts about a month ago. No attempts  RISK FACTORS FOR SUCIDE  Demographic factors:  Current mental status:  Loss factors:  Historical factors:  Risk Reduction factors:  Clinical factors: Cognitive features that contribute to risk: SUICIDE RISK:   MEDICAL HISTORY  Medical treatment and/or problems, explain: no problems Do you have any issues with chronic pain? No Name of primary care physician/last physical exam:  None  Last physical not known Allergies: pollen   Current medications: takes Claritin almost daily Prozac 30mg  daily Prescribed by: Claratin is over the counter, Prozac-Dr De Nurse Is there any history of mental health problems or substance abuse in your family, whom?  Depression-mom, paternal aunt, paternal grandmother, maternal grandmother No SA Has anyone in your family been hospitalized,  who, where, length of stay? No  SOCIAL/FAMILY HISTORY  Have you been married, how many times? No Do you have children? No How many pregnancies have you had? NA Who lives in your current household? dad Military history: No Religious/spiritual involvement: none What religion/faith base are you? NA Family of origin (childhood history): felt like he wasn't granted as many freedoms as his peers, didn't have many friends, bullied often throughout school,   Where were you born? Long Where did you grow up? Moved to [place] at age 58, [place] locations in [place] How many different homes have you lived? 5 Describe the atmosphere of the household where you grew up:  Grew up with both parents.    Do you have siblings, step/half siblings, list names, relation, sex, age? Brother, Laverna Peace 21 Are your parents separated/divorced, when and why? Divorced last year, "you could tell they didn't really care for one another"  "I'm just glad they stopped fighting all the time." Are your parents alive? yes Social supports (personal and professional): has some friends online, also friends with some of his brother's friends Doesn't feel he can reach out to these supports when stressed   Strengths and Weaknesses:  Strengths: tries to stay upbeat, good health, loves his family  Weaknesses: very self conscious, tends to compare himself to others, easily overwhelmed  EDUCATION  How many grades have you completed? High school diploma, some college Did you have any problems in school, what type? no Medications prescribed for these problems? none  EMPLOYMENT(financial issues) Has been working at [employer] for about 3 years.  Hours are part time and vary from 16-25 per week.  LEGAL HISTORY  No problems    Trauma/Abuse history: Have you ever been exposed to any form of abuse, what type? No Have you ever been exposed to something traumatic, describe? Mom had breast cancer when he was 60  years old.  Had to take on more responsibilities at that time.  Mom wasn't able to recover until Gradie was 24.    SUBSTANCE USE  Do you use Caffeine? yes Type, frequency? Pepsi, one cup 2-4 times a day  Do you use Nicotine? No Type, frequency, ppd?  Do you use Alcohol? No Type, frequency?   How old were you went you first tasted alcohol? Can't remember Was this accepted by your family? yes When was your last drink, type, how much? NA Have you ever used illicit drugs or taken more than prescribed, type, frequency, date of last usage? 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  Interventions to include CBT, communication/assertiveness, solution-focused

## 2014-07-03 ENCOUNTER — Ambulatory Visit (HOSPITAL_COMMUNITY): Payer: Self-pay | Admitting: Licensed Clinical Social Worker

## 2014-07-11 ENCOUNTER — Encounter (HOSPITAL_COMMUNITY): Payer: Self-pay | Admitting: Licensed Clinical Social Worker

## 2014-07-11 ENCOUNTER — Ambulatory Visit (INDEPENDENT_AMBULATORY_CARE_PROVIDER_SITE_OTHER): Payer: 59 | Admitting: Licensed Clinical Social Worker

## 2014-07-11 DIAGNOSIS — F4010 Social phobia, unspecified: Secondary | ICD-10-CM | POA: Insufficient documentation

## 2014-07-11 NOTE — Psych (Signed)
   THERAPIST PROGRESS NOTE  Session Time: 3:00pm-3:55pm  Participation Level: Active  Behavioral Response: CasualAlertAnxious  Type of Therapy: Individual Therapy  Treatment Goals addressed: Diagnosis: social anxiety  Interventions: CBT  Summary: Leonard Lucas's questionnaire results indicated that he has a clinically significant level of social anxiety.  He said he was not surprised with the results.  He indicated that he is open to exploring the interventions described from the workbook.  He agreed to do the homework.  He was able to complete one entry using a personal example.  He said he felt he understood how to complete the assignment.   No change with anxiety levels at this time.  He noted continued depression as well.  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      Suicidal/Homicidal: Nowithout intent/plan  Therapist Response: Had patient complete a Drusilla Kanner Shyness Questionnaire.  Went over the results.  Introduced patient to a workbook called Helping Your Shy and Socially Anxious Client.  Provided a brief overview of inteventions in the workbook.  Explained a SUDS scale that can be used to rate anxiety intensity.  Assigned homework to complete a Social Interaction Log.  Guided patient through an example.    Plan: Return again in one week.  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

## 2014-07-16 ENCOUNTER — Ambulatory Visit (INDEPENDENT_AMBULATORY_CARE_PROVIDER_SITE_OTHER): Payer: 59 | Admitting: Licensed Clinical Social Worker

## 2014-07-16 ENCOUNTER — Encounter (HOSPITAL_COMMUNITY): Payer: Self-pay | Admitting: Licensed Clinical Social Worker

## 2014-07-16 DIAGNOSIS — F4010 Social phobia, unspecified: Secondary | ICD-10-CM

## 2014-07-16 NOTE — Psych (Signed)
     THERAPIST PROGRESS NOTE  Session Time: 2:15pm-3:05pm  Participation Level: Active  Behavioral Response: CasualAlertAnxious  Type of Therapy: Individual Therapy  Treatment Goals addressed: Diagnosis: social anxiety  Interventions: CBT  Summary:  Leonard Lucas completed the Social Interaction Log homework.  Leonard Lucas responses indicated that he is very hard on himself and that he assumes others find him annoying.  For each interaction he recorded he noted that he thought the outcome had been negative.  He was able to identify 9 social situations for Leonard Lucas hierarchy. The situation he indicated as most feared was having to talk about a project he did.  The situation he indicated as being least anxiety provoking was engaging in friendly chit-chat with customers at work.  Leonard Lucas expressed understanding that part of treatment is going to involve exposing himself to these feared situations.  He said he is hoping to eventually feel less self conscious about Leonard Lucas drawing and actually enjoy it.   Leonard Lucas agreed to complete both homework assignments.     Suicidal/Homicidal: Nowithout intent/plan  Therapist Response:  Reviewed Social Interaction Log homework.  Talked about using it as a tool to assess changes with self-talk in social situations.  Emphasized that part of treatment is going to involve cognitive restructuring of Leonard Lucas negative thinking patterns.  Helped Leonard Lucas develop a hierarchy of feared social situations.  Had Leonard Lucas rate the situations from most to least distressing.  Asked him to rate the extent to which he avoids each situation and how much anxiety each one tends to cause for homework.  Asked him to complete another set of Social Interaction Logs for homework as well.    Plan: Return again in one week.  Diagnosis: F40.10 Social Anxiety Disorder    Leonard Lucas 07/16/2014     Leonard Scheuermann, LCSW 07/16/2014

## 2014-07-23 ENCOUNTER — Ambulatory Visit (INDEPENDENT_AMBULATORY_CARE_PROVIDER_SITE_OTHER): Payer: 59 | Admitting: Licensed Clinical Social Worker

## 2014-07-23 DIAGNOSIS — F4010 Social phobia, unspecified: Secondary | ICD-10-CM

## 2014-07-23 NOTE — Psych (Addendum)
     THERAPIST PROGRESS NOTE  Session Time: 11:05pm- 12:00  Participation Level: Active  Behavioral Response: CasualAlertAnxious  Type of Therapy: Individual Therapy  Treatment Goals addressed: Diagnosis: social anxiety  Interventions: CBT  Summary:  Leonard Lucas completed the Social Interaction Log homework.  Once again he indicated he was very hard on himself despite achieving his goal of getting feedback from a friend.  He indicated he could relate to the description of the 3 cycles of social anxiety.   Leonard Lucas indicated he is guilty of engaging in 10 out of the 12 cognitive distortions on a frequent basis.  When asked if he believes he is capable of learning how to change his thinking to be more positive and realistic he reported being optimistic.   Suicidal/Homicidal: Nowithout intent/plan  Therapist Response:  Reviewed Social Interaction Log homework.  Provided a description of the 3 Vicious Cycles of Shyness and Social Anxiety: fight or flight, shame and self-blame, and anger and resentment.  Discussed how it is possible to identify and change negative thinking patterns to be more helpful and self-supportive.  Reviewed a list of Cognitive Distortions.  Asked [name] to identify the ones he engages in most often.  Encouraged him to work on building greater awareness of when he is engaging in unhelpful thinking patterns.    Plan: Return again in one week.  Will start teaching patient how to challenge his cognitive distortions.  Diagnosis: F40.10 Social Anxiety Disorder    Armandina Stammer 07/23/2014     Garnette Scheuermann, LCSW 07/23/2014 Moved on 07/24/2014 due to clerical error in [place]. Garnette Scheuermann, LCSW 07/24/2014 1:38 PM

## 2014-07-24 NOTE — Psych (Signed)
Expand All Collapse All       THERAPIST PROGRESS NOTE  Session Time: 11:05pm- 12:00  Participation Level: Active  Behavioral Response: CasualAlertAnxious  Type of Therapy: Individual Therapy  Treatment Goals addressed: Diagnosis: social anxiety  Interventions: CBT  Summary: Tiron completed the Social Interaction Log homework. Once again he indicated he was very hard on himself despite achieving his goal of getting feedback from a friend. He indicated he could relate to the description of the 3 cycles of social anxiety. Nell indicated he is guilty of engaging in 10 out of the 12 cognitive distortions on a frequent basis. When asked if he believes he is capable of learning how to change his thinking to be more positive and realistic he reported being optimistic.   Suicidal/Homicidal: Nowithout intent/plan  Therapist Response: Reviewed Social Interaction Log homework. Provided a description of the 3 Vicious Cycles of Shyness and Social Anxiety: fight or flight, shame and self-blame, and anger and resentment. Discussed how it is possible to identify and change negative thinking patterns to be more helpful and self-supportive. Reviewed a list of Cognitive Distortions. Asked Yechezkel to identify the ones he engages in most often. Encouraged him to work on building greater awareness of when he is engaging in unhelpful thinking patterns.   Plan: Return again in one week. Will start teaching patient how to challenge his cognitive distortions.  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

## 2014-07-29 ENCOUNTER — Ambulatory Visit (HOSPITAL_COMMUNITY): Payer: Self-pay | Admitting: Licensed Clinical Social Worker

## 2014-07-30 ENCOUNTER — Ambulatory Visit (HOSPITAL_COMMUNITY): Payer: Self-pay | Admitting: Licensed Clinical Social Worker

## 2014-08-06 ENCOUNTER — Ambulatory Visit (INDEPENDENT_AMBULATORY_CARE_PROVIDER_SITE_OTHER): Payer: 59 | Admitting: Licensed Clinical Social Worker

## 2014-08-06 DIAGNOSIS — F4010 Social phobia, unspecified: Secondary | ICD-10-CM

## 2014-08-06 NOTE — Psych (Signed)
Expand All Collapse All       THERAPIST PROGRESS NOTE  Session Time: 11:10pm- 12:10  Participation Level: Active  Behavioral Response: CasualDrowsyAnxious  Type of Therapy: Individual Therapy  Treatment Goals addressed: Diagnosis: social anxiety  Interventions: CBT  Therapist Interventions: Reviewed Social Interaction Log homework. Challenged some of his negative automatic thoughts by asking questions.  Explained why exposure is an important part of treatment.  Processed an example from his fear hierarchy asking him to identify automatic negative thoughts.  Challenged those thoughts.  Explained how to go about replacing those thoughts with statements that are more positive and realistic.    Summary: Marylyn Ishihara completed one entry on his Social Interaction Log homework.  He acknowledged that he did not have evidence for the automatic thoughts he had.  He indicated he understood the process of challenging and replacing negative thoughts, but he noted that he believes actually doing it will be hard because he is not accustomed to telling himself positive things.  Continues to be very critical of himself. Therapist noted he seemed sleepy.  Alexandra reported he has not been sleeping well.     Suicidal/Homicidal: Denies both    Plan: Return again in one week.    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

## 2014-08-13 ENCOUNTER — Encounter (HOSPITAL_COMMUNITY): Payer: Self-pay | Admitting: Licensed Clinical Social Worker

## 2014-08-13 ENCOUNTER — Ambulatory Visit (INDEPENDENT_AMBULATORY_CARE_PROVIDER_SITE_OTHER): Payer: 59 | Admitting: Licensed Clinical Social Worker

## 2014-08-13 DIAGNOSIS — F4010 Social phobia, unspecified: Secondary | ICD-10-CM | POA: Diagnosis not present

## 2014-08-13 NOTE — Psych (Signed)
Expand All Collapse All       THERAPIST PROGRESS NOTE  Session Time: 11:05am- 12:05pm  Participation Level: Active  Behavioral Response: CasualDrowsyAnxious  Type of Therapy: Individual Therapy  Treatment Goals addressed: social anxiety  Interventions: CBT   Therapist Interventions: Had patient complete a Between Session Shyness Questionaire.  Reviewed Social Interaction Log homework. Discussed how it is common for individuals with social anxiety to attribute failure to themselves and attribute success to outside factors.  Emphasized how it is important to give yourself credit for things you are accomplishing.  Assigned homework to write down 3 accomplishments each day for the next week.  Explained the process of choosing a goal for exposure to a anxiety-provoking situation and breaking it down into gradual steps.       Summary: Leonard Lucas noted that he has been feeling more stressed lately because of his work schedule during the holiday season.  He is hoping things slow down after Christmas.  The therapist caught him nodding off at the end of the session.    Items on the Between session Shyness Questionnaire he indicated feeling to a high degree were body tension, concern about negative evaluation, embarrassment, and frustration.  He denied avoiding social interaction.  He indicated that he has been fairly satisfied with his relationships.    Delta indicated he could strongly relate to the practice of downplaying successes and taking responsibility for failures.  He agreed to do the homework assigned and acknowledged  he needs to give himself more credit.         Suicidal/Homicidal: Denies both    Plan: Return again in one week.    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

## 2014-08-20 ENCOUNTER — Ambulatory Visit (HOSPITAL_COMMUNITY): Payer: Self-pay | Admitting: Licensed Clinical Social Worker

## 2014-08-23 DIAGNOSIS — K260 Acute duodenal ulcer with hemorrhage: Secondary | ICD-10-CM

## 2014-08-23 DIAGNOSIS — K922 Gastrointestinal hemorrhage, unspecified: Secondary | ICD-10-CM

## 2014-08-23 HISTORY — DX: Gastrointestinal hemorrhage, unspecified: K92.2

## 2014-08-23 HISTORY — DX: Acute duodenal ulcer with hemorrhage: K26.0

## 2014-09-13 ENCOUNTER — Ambulatory Visit (HOSPITAL_COMMUNITY): Payer: Self-pay | Admitting: Psychiatry

## 2014-09-16 ENCOUNTER — Encounter (HOSPITAL_COMMUNITY): Payer: Self-pay | Admitting: Psychiatry

## 2014-09-16 ENCOUNTER — Ambulatory Visit (INDEPENDENT_AMBULATORY_CARE_PROVIDER_SITE_OTHER): Payer: 59 | Admitting: Psychiatry

## 2014-09-16 VITALS — BP 130/75 | HR 74 | Ht 72.0 in | Wt 248.0 lb

## 2014-09-16 DIAGNOSIS — F331 Major depressive disorder, recurrent, moderate: Secondary | ICD-10-CM

## 2014-09-16 DIAGNOSIS — F42 Obsessive-compulsive disorder: Secondary | ICD-10-CM

## 2014-09-16 DIAGNOSIS — F411 Generalized anxiety disorder: Secondary | ICD-10-CM

## 2014-09-16 DIAGNOSIS — F4010 Social phobia, unspecified: Secondary | ICD-10-CM

## 2014-09-16 MED ORDER — FLUOXETINE HCL 40 MG PO CAPS: 40.0000 mg | ORAL_CAPSULE | Freq: Every day | ORAL | Status: DC

## 2014-09-16 NOTE — Progress Notes (Signed)
Patient ID: Tunis Gentle, male   DOB: [birth year], 24 y.o.   MRN: [id number] [facility] Follow-up Outpatient Visit  Jermanie Minshall [id number] 23 y.o.  09/16/2014  Chief Complaint: depression follow up     History of Present Illness:    Mr. Duerson is a 24 y/o male with a past psychiatric history significant for symptoms of depression and anxiety. The patient is referred for psychiatric services for medication management.   [name] patient reports anxiety manageable when he is on his meds. Still has low confidence issue and difficult relationship with his Dad. Medication prozac helps some with his social anxiety.  He tolerated prozac reasonable and felt comfortable with its effect . Quality:  The patient reports he has been going twice weekly to the gym. He reports he has been doing well in school. He continues to socialize with his friends. He has decided to take up drawing but not focusing on it that much. Decreased motivation and increase worries. No effect on sleep. Feeling low in confidence.  In the area of affective symptoms, patient appears mildly depressed. Patient denies current suicidal ideation, intent, or plan. Patient denies current homicidal ideation, intent, or plan. Patient denies auditory hallucinations. Patient denies visual hallucinations. Patient denies symptoms of paranoia. Patient states sleep is 7 tp 8 hours. . Appetite is normal. Energy level is low.  Patient denies guilt, but endorses hopelessness, helplessness.  . Severity:  Depression: 6/10 (0=Very depressed; 5=Neutral; 10=Very Happy)  Anxiety- 4/10 (0=no anxiety; 5= moderate/tolerable anxiety; 10= panic attacks)  . Duration;  Anxiety-since middle school. Recognized about 2 years ago  . Timing: Throughout the day, mostly days that he has work.  . Context: Obsessing about annoying a person, not being good at his work at times. But less critical of himself now.  . Modifying factors: Mood improves with  not quitting.   Past Medical History  Diagnosis Date  . Anxiety   . Depression    Family History  Problem Relation Age of Onset  . Breast cancer Mother   . Depression Mother   . Depression Paternal Aunt     Outpatient Encounter Prescriptions as of 09/16/2014  Medication Sig  . famotidine (PEPCID) 20 MG tablet Take 1 tablet (20 mg total) by mouth 2 (two) times daily.  . Loratadine 10 MG CAPS Take 10 mg by mouth.  . [DISCONTINUED] FLUoxetine (PROZAC) 10 MG capsule Take 1 capsule (10 mg total) by mouth daily.  . [DISCONTINUED] FLUoxetine (PROZAC) 20 MG capsule Take 1 capsule (20 mg total) by mouth daily.  Marland Kitchen FLUoxetine (PROZAC) 40 MG capsule Take 1 capsule (40 mg total) by mouth daily.    No results found for this or any previous visit (from the past 2160 hour(s)).  BP 130/75 mmHg  Pulse 74  Ht 6' (1.829 m)  Wt 248 lb (112.492 kg)  BMI 33.63 kg/m2   Review of Systems  Constitutional: Negative.   Cardiovascular: Negative for chest pain.  Neurological: Negative for tremors.  Psychiatric/Behavioral: Positive for depression. The patient is nervous/anxious.     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 Generalized anxiety disorder  Treatment Plan:   Increase  prozac 40mg . If needed will add buspirone for anxiety.  Schedule with therapist for counselling Pertinent Labs and Relevant Prior Notes reviewed. Medication Side effects, benefits and risks reviewed/discussed with Patient. Time given for  patient to respond and asks questions regarding the Diagnosis and Medications. Safety concerns and to report to ER if suicidal or call 911. Relevant Medications refilled or called in to pharmacy. Discussed weight maintenance and Sleep Hygiene. Follow up with Primary care provider in regards to Medical conditions. Recommend compliance with medications and follow up office appointments. Discussed to avail opportunity to consider or/and continue Individual therapy with Counselor. Greater than 50% of time was spend in counseling and coordination of care with the patient.  Schedule for Follow up visit in 1  months or call in earlier as necessary.  [name], MD 09/16/2014

## 2014-09-17 ENCOUNTER — Ambulatory Visit (INDEPENDENT_AMBULATORY_CARE_PROVIDER_SITE_OTHER): Payer: 59 | Admitting: Licensed Clinical Social Worker

## 2014-09-17 DIAGNOSIS — F331 Major depressive disorder, recurrent, moderate: Secondary | ICD-10-CM

## 2014-09-17 DIAGNOSIS — F4010 Social phobia, unspecified: Secondary | ICD-10-CM | POA: Diagnosis not present

## 2014-09-17 NOTE — Psych (Signed)
Expand All Collapse All       THERAPIST PROGRESS NOTE  Session Time: 4:05pm-5pm  Participation Level: Active  Behavioral Response: Casual   Alert         Anxious  Type of Therapy: Individual Therapy  Treatment Goals addressed: social anxiety  Interventions: CBT   Therapist Interventions: Gathered information about significant events and changes in mood and functioning since last seen approximately a month ago. Discussed core beliefs and how they influence mood and behavior.  Asked patient to identify some of his core beliefs from a list.   Discussed how you can learn to change those beliefs by finding evidence against them.  Gave homework to choose one core belief and record evidence against it.        Summary:  Reported he has been working a lot.  Overall mood has not improved.  Noted some progress with catching himself having negative thoughts and shifting his focus to a more positive message.    Core beliefs he identified were: I'm stupid. I'm worthless. Unlovable Not good enough A bad person [employer]  Reported that he had some trouble with the homework assignment to record accomplishments.  Agreed to complete homework refuting negative core beliefs.            Suicidal/Homicidal: Denies both    Plan: Return again in    [place] Disorder                        Persistent Depressive Disorder    Garnette Scheuermann, LCSW 07/23/2014

## 2014-10-16 ENCOUNTER — Ambulatory Visit (INDEPENDENT_AMBULATORY_CARE_PROVIDER_SITE_OTHER): Payer: 59 | Admitting: Licensed Clinical Social Worker

## 2014-10-16 DIAGNOSIS — F4010 Social phobia, unspecified: Secondary | ICD-10-CM | POA: Diagnosis not present

## 2014-10-16 NOTE — Psych (Signed)
Expand All Collapse All       THERAPIST PROGRESS NOTE  Session Time: [id number]  Participation Level: Active  Behavioral Response: Casual   Alert    Euthymic  Type of Therapy: Individual Therapy  Treatment Goals addressed: social anxiety  Interventions: CBT   Therapist Interventions: Checked on homework to record evidence against a core belief.  Introduced the concept of mindfulness.  Explained how practicing mindfulness can help a person cope with anxiety.  Described how you can choose to do any task in a mindful way.  Practiced a mindful observation exercise.         Summary:  Patient worked on finding evidence against the belief that he is worthless.  Noted that this was challenging.  Seemed to understand the concepts presented today.  Indicated he is hopeful that practicing the exercises will prove beneficial.     Reported some progress with treatment goal  He said, "I'm able to relax more with other people and enjoy being around them."               Suicidal/Homicidal: Denies both    Plan: Scheduled to return in mid-March.  Will check on practice of mindfulness.     Diagnosis:Social Anxiety Disorder                        Persistent Depressive Disorder    Leonard Lucas 07/23/2014

## 2014-10-17 ENCOUNTER — Ambulatory Visit (INDEPENDENT_AMBULATORY_CARE_PROVIDER_SITE_OTHER): Payer: 59 | Admitting: Psychiatry

## 2014-10-17 ENCOUNTER — Encounter (HOSPITAL_COMMUNITY): Payer: Self-pay | Admitting: Psychiatry

## 2014-10-17 ENCOUNTER — Ambulatory Visit (HOSPITAL_COMMUNITY): Payer: Self-pay | Admitting: Licensed Clinical Social Worker

## 2014-10-17 VITALS — BP 120/73 | HR 63 | Ht 72.0 in | Wt 248.0 lb

## 2014-10-17 DIAGNOSIS — F42 Obsessive-compulsive disorder: Secondary | ICD-10-CM

## 2014-10-17 DIAGNOSIS — F331 Major depressive disorder, recurrent, moderate: Secondary | ICD-10-CM

## 2014-10-17 DIAGNOSIS — F429 Obsessive-compulsive disorder, unspecified: Secondary | ICD-10-CM

## 2014-10-17 DIAGNOSIS — F4010 Social phobia, unspecified: Secondary | ICD-10-CM

## 2014-10-17 DIAGNOSIS — F411 Generalized anxiety disorder: Secondary | ICD-10-CM

## 2014-10-17 MED ORDER — FLUOXETINE HCL 40 MG PO CAPS: 40.0000 mg | ORAL_CAPSULE | Freq: Every day | ORAL | Status: DC

## 2014-10-17 NOTE — Progress Notes (Signed)
Patient ID: Leonard Lucas, male   DOB: 1990/11/27, 24 y.o.   MRN: [id number] [facility] Follow-up Outpatient Visit  Leonard Lucas [id number] 24 y.o.  10/17/2014  Chief Complaint: depression follow up   History of Present Illness:    Leonard Lucas is a 24 y/o male with a past psychiatric history significant for symptoms of depression and anxiety. The patient is referred for psychiatric services for medication management.   Patient is having anxiety symptoms including some obsessions. Low self-confidence, but he is indulging more in arts activities and some socialization. Last visit increase the Prozac to 40 mg. That has helped his anxiety symptoms. He feels still at times down but not hopeless.  He feels increasing the medication has helped and has not added more side effects. Depression: 6/10 (0=Very depressed; 5=Neutral; 10=Very Happy)  Anxiety- 4/10 (0=no anxiety; 5= moderate/tolerable anxiety; 10= panic attacks)  . Duration;  Anxiety-since middle school. Recognized about 2 years ago  . Timing: Throughout the day, mostly days that he has work.  . Context: Obsessing about annoying a person, not being good at his work at times. But less critical of himself now.  . Modifying factors: Mood improves with not quitting.   Past Medical History  Diagnosis Date  . Anxiety   . Depression    Family History  Problem Relation Age of Onset  . Breast cancer Mother   . Depression Mother   . Depression Paternal Aunt     Outpatient Encounter Prescriptions as of 10/17/2014  Medication Sig  . famotidine (PEPCID) 20 MG tablet Take 1 tablet (20 mg total) by mouth 2 (two) times daily.  Marland Kitchen FLUoxetine (PROZAC) 40 MG capsule Take 1 capsule (40 mg total) by mouth daily.  . Loratadine 10 MG CAPS Take 10 mg by mouth.  . [DISCONTINUED] FLUoxetine (PROZAC) 40 MG capsule Take 1 capsule (40 mg total) by mouth daily.    No results found for this or any previous visit (from the past 2160  hour(s)).  BP 120/73 mmHg  Pulse 63  Ht 6' (1.829 m)  Wt 248 lb (112.492 kg)  BMI 33.63 kg/m2   Review of Systems  Constitutional: Negative.   Cardiovascular: Negative for chest pain.  Skin: Negative for rash.  Neurological: Negative for tremors.  Psychiatric/Behavioral: The patient is not nervous/anxious.     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 Generalized anxiety disorder  Treatment Plan:   keep Prozac 40 mg. We will consider buspirone if needed to add but as of now symptoms of improved on Prozac 40 mg. Schedule with therapist for counselling Pertinent Labs and Relevant Prior Notes reviewed. Medication Side effects, benefits and risks reviewed/discussed with Patient. Time given for patient to respond and asks questions regarding the Diagnosis and Medications. Safety concerns and to report to ER if suicidal or call 911. Relevant Medications refilled or called in to pharmacy. Discussed weight maintenance and Sleep Hygiene. Follow up with Primary care provider in regards to Medical conditions. Recommend compliance with medications and follow up office appointments. Discussed to avail opportunity to consider or/and continue Individual therapy with Counselor. Greater than 50% of time was spend in  counseling and coordination of care with the patient.  Schedule for Follow up visit in 2  months or call in earlier as necessary.  [name], MD 10/17/2014

## 2014-10-30 ENCOUNTER — Ambulatory Visit (HOSPITAL_COMMUNITY): Payer: Self-pay | Admitting: Licensed Clinical Social Worker

## 2014-11-13 ENCOUNTER — Ambulatory Visit (HOSPITAL_COMMUNITY): Payer: Self-pay | Admitting: Licensed Clinical Social Worker

## 2014-11-27 ENCOUNTER — Ambulatory Visit (HOSPITAL_COMMUNITY): Payer: Self-pay | Admitting: Licensed Clinical Social Worker

## 2014-12-11 ENCOUNTER — Ambulatory Visit (HOSPITAL_COMMUNITY): Payer: Self-pay | Admitting: Licensed Clinical Social Worker

## 2014-12-17 ENCOUNTER — Ambulatory Visit (INDEPENDENT_AMBULATORY_CARE_PROVIDER_SITE_OTHER): Payer: 59 | Admitting: Psychiatry

## 2014-12-17 ENCOUNTER — Encounter (HOSPITAL_COMMUNITY): Payer: Self-pay | Admitting: Psychiatry

## 2014-12-17 VITALS — BP 114/80 | HR 79 | Ht 72.0 in | Wt 251.0 lb

## 2014-12-17 DIAGNOSIS — F331 Major depressive disorder, recurrent, moderate: Secondary | ICD-10-CM | POA: Diagnosis not present

## 2014-12-17 DIAGNOSIS — F429 Obsessive-compulsive disorder, unspecified: Secondary | ICD-10-CM

## 2014-12-17 DIAGNOSIS — F411 Generalized anxiety disorder: Secondary | ICD-10-CM | POA: Diagnosis not present

## 2014-12-17 DIAGNOSIS — F42 Obsessive-compulsive disorder: Secondary | ICD-10-CM | POA: Diagnosis not present

## 2014-12-17 DIAGNOSIS — F4010 Social phobia, unspecified: Secondary | ICD-10-CM

## 2014-12-17 MED ORDER — FLUOXETINE HCL 40 MG PO CAPS: 40.0000 mg | ORAL_CAPSULE | Freq: Every day | ORAL | Status: DC

## 2014-12-17 NOTE — Progress Notes (Signed)
Patient ID: GEORG ANG, male   DOB: 1991/06/11, 24 y.o.   MRN: [id number] [facility] Follow-up Outpatient Visit  Leonard Lucas [id number] 24 y.o.  12/17/2014  Chief Complaint: depression follow up   History of Present Illness:    Leonard Lucas is a 24 y/o male with a past psychiatric history significant for symptoms of depression and anxiety. The patient is referred for psychiatric services for medication management.   Suicidality is doing better regarding anxiety less social anxiety. He believes medications helping now at this dose. He is trying to keep himself busy there is no reported side effects on Prozac He feels increasing the medication has helped and has no side effects. Depression: 7/10 (0=Very depressed; 5=Neutral; 10=Very Happy)  Anxiety- 4/10 (0=no anxiety; 5= moderate/tolerable anxiety; 10= panic attacks)  . Duration;  Anxiety-since middle school. Recognized about 2 years ago  . Timing: Throughout the day, mostly days that he has work when he has it. Feels more balanced with meds  . Context: Obsessing about annoying a person, not being good at his work at times. But less critical of himself now.  . Modifying factors: Mood improves with not quitting.   Past Medical History  Diagnosis Date  . Anxiety   . Depression    Family History  Problem Relation Age of Onset  . Breast cancer Mother   . Depression Mother   . Depression Paternal Aunt     Outpatient Encounter Prescriptions as of 12/17/2014  Medication Sig  . famotidine (PEPCID) 20 MG tablet Take 1 tablet (20 mg total) by mouth 2 (two) times daily.  Marland Kitchen FLUoxetine (PROZAC) 40 MG capsule Take 1 capsule (40 mg total) by mouth daily.  . Loratadine 10 MG CAPS Take 10 mg by mouth.  . [DISCONTINUED] FLUoxetine (PROZAC) 40 MG capsule Take 1 capsule (40 mg total) by mouth daily.    No results found for this or any previous visit (from the past 2160 hour(s)).  BP 114/80 mmHg  Pulse 79  Ht 6' (1.829 m)  Wt  251 lb (113.853 kg)  BMI 34.03 kg/m2   Review of Systems  Constitutional: Negative.   Cardiovascular: Negative for chest pain.  Skin: Negative for rash.  Psychiatric/Behavioral: Negative for depression and substance abuse.    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 Generalized anxiety disorder  Treatment Plan:   keep Prozac 40 mg. No need to add buspirone for now.   Pertinent Labs and Relevant Prior Notes reviewed. Medication Side effects, benefits and risks reviewed/discussed with Patient. Time given for patient to respond and asks questions regarding the Diagnosis and Medications. Safety concerns and to report to ER if suicidal or call 911. Relevant Medications refilled or called in to pharmacy. Discussed weight maintenance and Sleep Hygiene. Follow up with Primary care provider in regards to Medical conditions. Recommend compliance with medications and follow up office appointments. Discussed to avail opportunity to consider or/and continue Individual therapy with Counselor. Greater than 50% of time was spend in counseling and coordination of care with the patient.  Schedule for Follow up visit in 2  months or call in earlier as necessary.  Mazella Deen,  Roslynn Holte, MD 12/17/2014

## 2014-12-25 ENCOUNTER — Ambulatory Visit (HOSPITAL_COMMUNITY): Payer: Self-pay | Admitting: Licensed Clinical Social Worker

## 2015-01-19 ENCOUNTER — Telehealth: Payer: Self-pay | Admitting: Family

## 2015-01-19 DIAGNOSIS — A09 Infectious gastroenteritis and colitis, unspecified: Secondary | ICD-10-CM

## 2015-01-19 NOTE — Progress Notes (Signed)
We are sorry that you are not feeling well.  Here is how we plan to help!  Based on what you have shared with me it looks like you have Acute Infectious Diarrhea.  Most cases of acute diarrhea are due to infections with virus and bacteria and are self-limited conditions lasting less than 14 days.  For your symptoms you may take Imodium 2 mg tablets that are over the counter at your local pharmacy. Take two tablet now and then one after each loose stool up to 6 a day.  Antibiotics are not needed for most people with diarrhea.   HOME CARE  We recommend changing your diet to help with your symptoms for the next few days.  Drink plenty of fluids that contain water salt and sugar. Sports drinks such as Gatorade may help.   You may try broths, soups, bananas, applesauce, soft breads, mashed potatoes or crackers.   You are considered infectious for as long as the diarrhea continues. Hand washing or use of alcohol based hand sanitizers is recommend.  It is best to stay out of work or school until your symptoms stop.   GET HELP RIGHT AWAY  If you have dark yellow colored urine or do not pass urine frequently you should drink more fluids.    If your symptoms worsen   If you feel like you are going to pass out (faint)  You have a new problem  MAKE SURE YOU   Understand these instructions.  Will watch your condition.  Will get help right away if you are not doing well or get worse.  Your e-visit answers were reviewed by a board certified advanced clinical practitioner to complete your personal care plan.  Depending on the condition, your plan could have included both over the counter or prescription medications.  If there is a problem please reply  once you have received a response from your provider.  Your safety is important to us.  If you have drug allergies check your prescription carefully.    You can use MyChart to ask questions about today's visit, request a non-urgent call  back, or ask for a work or school excuse.  You will get an e-mail in the next two days asking about your experience.  I hope that your e-visit has been valuable and will speed your recovery. Thank you for using e-visits.   

## 2015-01-21 ENCOUNTER — Encounter (HOSPITAL_COMMUNITY): Payer: Self-pay | Admitting: Physical Medicine and Rehabilitation

## 2015-01-21 ENCOUNTER — Encounter (HOSPITAL_COMMUNITY)
Admission: EM | Disposition: A | Discharge status: Home / Self Care | Payer: Self-pay | Source: Home / Self Care | Attending: Internal Medicine

## 2015-01-21 ENCOUNTER — Encounter: Payer: Self-pay | Admitting: *Deleted

## 2015-01-21 ENCOUNTER — Emergency Department (INDEPENDENT_AMBULATORY_CARE_PROVIDER_SITE_OTHER)
Admission: EM | Admit: 2015-01-21 | Discharge: 2015-01-21 | Disposition: A | Discharge status: Other Acute Inpatient Hospital | Payer: 59 | Source: Home / Self Care | Attending: Emergency Medicine | Admitting: Emergency Medicine

## 2015-01-21 ENCOUNTER — Inpatient Hospital Stay (HOSPITAL_COMMUNITY)
Admission: EM | Admit: 2015-01-21 | Discharge: 2015-01-24 | DRG: 378 | Disposition: A | Discharge status: Home / Self Care | Payer: 59 | Attending: Internal Medicine | Admitting: Internal Medicine

## 2015-01-21 DIAGNOSIS — D62 Acute posthemorrhagic anemia: Secondary | ICD-10-CM | POA: Diagnosis not present

## 2015-01-21 DIAGNOSIS — Z6836 Body mass index (BMI) 36.0-36.9, adult: Secondary | ICD-10-CM

## 2015-01-21 DIAGNOSIS — K921 Melena: Principal | ICD-10-CM | POA: Diagnosis present

## 2015-01-21 DIAGNOSIS — K760 Fatty (change of) liver, not elsewhere classified: Secondary | ICD-10-CM | POA: Diagnosis present

## 2015-01-21 DIAGNOSIS — J329 Chronic sinusitis, unspecified: Secondary | ICD-10-CM | POA: Diagnosis not present

## 2015-01-21 DIAGNOSIS — F4010 Social phobia, unspecified: Secondary | ICD-10-CM | POA: Diagnosis present

## 2015-01-21 DIAGNOSIS — K2901 Acute gastritis with bleeding: Secondary | ICD-10-CM

## 2015-01-21 DIAGNOSIS — K2960 Other gastritis without bleeding: Secondary | ICD-10-CM | POA: Diagnosis present

## 2015-01-21 DIAGNOSIS — K922 Gastrointestinal hemorrhage, unspecified: Secondary | ICD-10-CM | POA: Diagnosis not present

## 2015-01-21 DIAGNOSIS — R42 Dizziness and giddiness: Secondary | ICD-10-CM | POA: Diagnosis not present

## 2015-01-21 DIAGNOSIS — K219 Gastro-esophageal reflux disease without esophagitis: Secondary | ICD-10-CM | POA: Diagnosis present

## 2015-01-21 DIAGNOSIS — E669 Obesity, unspecified: Secondary | ICD-10-CM | POA: Diagnosis present

## 2015-01-21 DIAGNOSIS — K264 Chronic or unspecified duodenal ulcer with hemorrhage: Secondary | ICD-10-CM | POA: Diagnosis not present

## 2015-01-21 DIAGNOSIS — F419 Anxiety disorder, unspecified: Secondary | ICD-10-CM | POA: Diagnosis present

## 2015-01-21 DIAGNOSIS — K2900 Acute gastritis without bleeding: Secondary | ICD-10-CM | POA: Diagnosis not present

## 2015-01-21 DIAGNOSIS — R1084 Generalized abdominal pain: Secondary | ICD-10-CM | POA: Diagnosis present

## 2015-01-21 DIAGNOSIS — F331 Major depressive disorder, recurrent, moderate: Secondary | ICD-10-CM | POA: Diagnosis present

## 2015-01-21 DIAGNOSIS — R0602 Shortness of breath: Secondary | ICD-10-CM | POA: Diagnosis not present

## 2015-01-21 DIAGNOSIS — R710 Precipitous drop in hematocrit: Secondary | ICD-10-CM

## 2015-01-21 DIAGNOSIS — D72829 Elevated white blood cell count, unspecified: Secondary | ICD-10-CM | POA: Diagnosis not present

## 2015-01-21 DIAGNOSIS — D649 Anemia, unspecified: Secondary | ICD-10-CM | POA: Diagnosis not present

## 2015-01-21 DIAGNOSIS — Z8669 Personal history of other diseases of the nervous system and sense organs: Secondary | ICD-10-CM

## 2015-01-21 HISTORY — PX: ESOPHAGOGASTRODUODENOSCOPY: SHX5428

## 2015-01-21 LAB — COMPREHENSIVE METABOLIC PANEL
ALT: 21 U/L (ref 17–63)
AST: 18 U/L (ref 15–41)
Albumin: 3.4 g/dL — ABNORMAL LOW (ref 3.5–5.0)
Alkaline Phosphatase: 55 U/L (ref 38–126)
Anion gap: 10 (ref 5–15)
BUN: 25 mg/dL — AB (ref 6–20)
CHLORIDE: 103 mmol/L (ref 101–111)
CO2: 23 mmol/L (ref 22–32)
CREATININE: 0.79 mg/dL (ref 0.61–1.24)
Calcium: 8.4 mg/dL — ABNORMAL LOW (ref 8.9–10.3)
GFR calc Af Amer: >60 mL/min (ref >60)
GFR calc non Af Amer: >60 mL/min (ref >60)
GLUCOSE: 106 mg/dL — AB (ref 65–99)
Potassium: 3.6 mmol/L (ref 3.5–5.1)
Sodium: 136 mmol/L (ref 135–145)
Total Bilirubin: 0.4 mg/dL (ref 0.3–1.2)
Total Protein: 5.4 g/dL — ABNORMAL LOW (ref 6.5–8.1)

## 2015-01-21 LAB — CBC WITH DIFFERENTIAL/PLATELET
Basophils Absolute: 0 10*3/uL (ref 0.0–0.1)
Basophils Relative: 0 % (ref 0–1)
EOS PCT: 0 % (ref 0–5)
Eosinophils Absolute: 0 10*3/uL (ref 0.0–0.7)
HEMATOCRIT: 19.5 % — AB (ref 39.0–52.0)
HEMOGLOBIN: 6.2 g/dL — AB (ref 13.0–17.0)
LYMPHS ABS: 1.7 10*3/uL (ref 0.7–4.0)
LYMPHS PCT: 13 % (ref 12–46)
MCH: 26.4 pg (ref 26.0–34.0)
MCHC: 31.8 g/dL (ref 30.0–36.0)
MCV: 83 fL (ref 78.0–100.0)
Monocytes Absolute: 0.5 10*3/uL (ref 0.1–1.0)
Monocytes Relative: 4 % (ref 3–12)
Neutro Abs: 10.1 10*3/uL — ABNORMAL HIGH (ref 1.7–7.7)
Neutrophils Relative %: 82 % — ABNORMAL HIGH (ref 43–77)
Platelets: 378 10*3/uL (ref 150–400)
RBC: 2.35 MIL/uL — ABNORMAL LOW (ref 4.22–5.81)
RDW: 13.6 % (ref 11.5–15.5)
WBC: 12.3 10*3/uL — ABNORMAL HIGH (ref 4.0–10.5)

## 2015-01-21 LAB — URINALYSIS, ROUTINE W REFLEX MICROSCOPIC
Bilirubin Urine: NEGATIVE
GLUCOSE, UA: NEGATIVE mg/dL
Hgb urine dipstick: NEGATIVE
Ketones, ur: NEGATIVE mg/dL
Leukocytes, UA: NEGATIVE
Nitrite: NEGATIVE
Protein, ur: NEGATIVE mg/dL
SPECIFIC GRAVITY, URINE: 1.023 (ref 1.005–1.030)
UROBILINOGEN UA: 0.2 mg/dL (ref 0.0–1.0)
pH: 6 (ref 5.0–8.0)

## 2015-01-21 LAB — CBC
HCT: 21.8 % — ABNORMAL LOW (ref 39.0–52.0)
HEMOGLOBIN: 7.3 g/dL — AB (ref 13.0–17.0)
MCH: 27.9 pg (ref 26.0–34.0)
MCHC: 33.5 g/dL (ref 30.0–36.0)
MCV: 83.2 fL (ref 78.0–100.0)
Platelets: 265 10*3/uL (ref 150–400)
RBC: 2.62 MIL/uL — AB (ref 4.22–5.81)
RDW: 13.8 % (ref 11.5–15.5)
WBC: 12.2 10*3/uL — ABNORMAL HIGH (ref 4.0–10.5)

## 2015-01-21 LAB — POCT CBC W AUTO DIFF (K'VILLE URGENT CARE)

## 2015-01-21 LAB — PREPARE RBC (CROSSMATCH)

## 2015-01-21 LAB — POCT FASTING CBG KUC MANUAL ENTRY: POCT Glucose (KUC): 115 mg/dL — AB (ref 70–99)

## 2015-01-21 LAB — POC OCCULT BLOOD, ED: FECAL OCCULT BLD: POSITIVE — AB

## 2015-01-21 LAB — CBG MONITORING, ED: GLUCOSE-CAPILLARY: 109 mg/dL — AB (ref 65–99)

## 2015-01-21 LAB — ABO/RH: ABO/RH(D): A POS

## 2015-01-21 SURGERY — EGD (ESOPHAGOGASTRODUODENOSCOPY)
Anesthesia: Moderate Sedation

## 2015-01-21 MED ORDER — ONDANSETRON HCL 4 MG PO TABS: 4.0000 mg | ORAL_TABLET | Freq: Four times a day (QID) | ORAL | Status: DC | PRN

## 2015-01-21 MED ORDER — DIPHENHYDRAMINE HCL 50 MG/ML IJ SOLN
INTRAMUSCULAR | Status: DC | PRN
  Administered 2015-01-21: 25 mg via INTRAVENOUS

## 2015-01-21 MED ORDER — MORPHINE SULFATE 2 MG/ML IJ SOLN
1.0000 mg | INTRAMUSCULAR | Status: DC | PRN
  Administered 2015-01-21 (×2): 1 mg via INTRAVENOUS
  Filled 2015-01-21 (×3): qty 1

## 2015-01-21 MED ORDER — MIDAZOLAM HCL 10 MG/2ML IJ SOLN
INTRAMUSCULAR | Status: DC | PRN
  Administered 2015-01-21: 1 mg via INTRAVENOUS
  Administered 2015-01-21 (×2): 2 mg via INTRAVENOUS

## 2015-01-21 MED ORDER — PANTOPRAZOLE SODIUM 40 MG IV SOLR
40.0000 mg | Freq: Once | INTRAVENOUS | Status: AC
  Administered 2015-01-21: 40 mg via INTRAVENOUS
  Filled 2015-01-21: qty 40

## 2015-01-21 MED ORDER — SODIUM CHLORIDE 0.9 % IV SOLN
Freq: Once | INTRAVENOUS | Status: AC
  Administered 2015-01-21: 18:00:00 via INTRAVENOUS

## 2015-01-21 MED ORDER — SODIUM CHLORIDE 0.9 % IV SOLN
INTRAVENOUS | Status: DC
  Administered 2015-01-21 – 2015-01-24 (×3): via INTRAVENOUS

## 2015-01-21 MED ORDER — ONDANSETRON HCL 4 MG/2ML IJ SOLN: 4.0000 mg | Freq: Four times a day (QID) | INTRAMUSCULAR | Status: DC | PRN

## 2015-01-21 MED ORDER — SODIUM CHLORIDE 0.9 % IV BOLUS (SEPSIS)
1000.0000 mL | Freq: Once | INTRAVENOUS | Status: AC
  Administered 2015-01-21: 1000 mL via INTRAVENOUS

## 2015-01-21 MED ORDER — SODIUM CHLORIDE 0.9 % IV SOLN
10.0000 mL/h | Freq: Once | INTRAVENOUS | Status: AC
  Administered 2015-01-21: 10 mL/h via INTRAVENOUS

## 2015-01-21 MED ORDER — SODIUM CHLORIDE 0.9 % IV SOLN
8.0000 mg/h | INTRAVENOUS | Status: DC
  Administered 2015-01-21 – 2015-01-24 (×7): 8 mg/h via INTRAVENOUS
  Filled 2015-01-21 (×14): qty 80

## 2015-01-21 MED ORDER — FENTANYL CITRATE (PF) 100 MCG/2ML IJ SOLN
INTRAMUSCULAR | Status: AC
  Filled 2015-01-21: qty 2

## 2015-01-21 MED ORDER — FENTANYL CITRATE (PF) 100 MCG/2ML IJ SOLN
INTRAMUSCULAR | Status: DC | PRN
  Administered 2015-01-21 (×2): 25 ug via INTRAVENOUS

## 2015-01-21 MED ORDER — SODIUM CHLORIDE 0.9 % IJ SOLN
3.0000 mL | Freq: Two times a day (BID) | INTRAMUSCULAR | Status: DC
  Administered 2015-01-21 – 2015-01-23 (×5): 3 mL via INTRAVENOUS

## 2015-01-21 MED ORDER — DIPHENHYDRAMINE HCL 50 MG/ML IJ SOLN
INTRAMUSCULAR | Status: AC
  Filled 2015-01-21: qty 1

## 2015-01-21 MED ORDER — BUTAMBEN-TETRACAINE-BENZOCAINE 2-2-14 % EX AERO
INHALATION_SPRAY | CUTANEOUS | Status: DC | PRN
  Administered 2015-01-21: 2 via TOPICAL

## 2015-01-21 MED ORDER — MIDAZOLAM HCL 5 MG/ML IJ SOLN
INTRAMUSCULAR | Status: AC
  Filled 2015-01-21: qty 2

## 2015-01-21 NOTE — ED Notes (Signed)
Blood return to blood banks due endo. Procedure at this time.

## 2015-01-21 NOTE — ED Notes (Signed)
Report attempted 

## 2015-01-21 NOTE — ED Provider Notes (Signed)
CSN: [id number]     Arrival date & time 01/21/15  1125 History   First MD Initiated Contact with Patient 01/21/15 1205     Chief Complaint  Patient presents with  . Rectal Bleeding     (Consider location/radiation/quality/duration/timing/severity/associated sxs/prior Treatment) Patient is a 24 y.o. male presenting with hematochezia. The history is provided by the patient. No language interpreter was used.  Rectal Bleeding Associated symptoms: light-headedness and vomiting   Associated symptoms: no abdominal pain, no dizziness and no fever   Leonard Lucas is a 24 year old male with past medical history of anxiety and depression presenting to the ED with hematochezia that is now turned into melena approximate 3 days ago. Patient reported that he started to have diarrhea 3 days ago, stated that he noticed right red blood in his stools have now turned into black tarry stools with each bowel movement since 3 days ago. Stated that he has at least 5 bowel movements per day. Reported that he's been having nausea and vomiting, reported vomiting 2 today mainly of stomach contents, denied blood. Patient reported that he's been feeling short of breath and fatigued. File reported that him and his girlfriend had diarrhea on Saturday, 3 days ago. Patient reports that he does take Excedrin, 2 times per day. Father reported that mother has factor V Leiden deficiency, but states that patient has not been tested for this. Denied fever, chills, chest pain, difficulty breathing, travels, neck pain, neck stiffness, back pain, decreased urine, hematuria, travels. PCP Dr. Ileene Rubens   Past Medical History  Diagnosis Date  . Anxiety   . Depression    Past Surgical History  Procedure Laterality Date  . Wisdom tooth extraction  08/2010   Family History  Problem Relation Age of Onset  . Breast cancer Mother   . Depression Mother   . Depression Paternal Aunt    History  Substance Use Topics  . Smoking status: Never  Smoker   . Smokeless tobacco: Not on file  . Alcohol Use: No    Review of Systems  Constitutional: Positive for fatigue. Negative for fever and chills.  Respiratory: Positive for shortness of breath. Negative for chest tightness.   Cardiovascular: Negative for chest pain.  Gastrointestinal: Positive for nausea, vomiting, diarrhea, blood in stool and hematochezia. Negative for abdominal pain and constipation.  Genitourinary: Negative for dysuria and hematuria.  Neurological: Positive for light-headedness. Negative for dizziness.      Allergies  Review of patient's allergies indicates no known allergies.  Home Medications   Prior to Admission medications   Medication Sig Start Date End Date Taking? Authorizing Provider  aspirin-acetaminophen-caffeine (EXCEDRIN MIGRAINE) [phone] MG per tablet Take 2 tablets by mouth every 6 (six) hours as needed for headache.   Yes Historical Provider, MD  FLUoxetine (PROZAC) 40 MG capsule Take 1 capsule (40 mg total) by mouth daily. 12/17/14  Yes [name], MD  Loratadine 10 MG CAPS Take 10 mg by mouth.   Yes Historical Provider, MD  famotidine (PEPCID) 20 MG tablet Take 1 tablet (20 mg total) by mouth 2 (two) times daily. Patient not taking: Reported on 01/21/2015 03/09/14   Jacqulyn Cane, MD   BP 112/60 mmHg  Pulse 99  Temp(Src) 99.4 F (37.4 C) (Oral)  Resp 21  Ht 5\' 8"  (1.727 m)  Wt 240 lb (108.863 kg)  BMI 36.50 kg/m2  SpO2 100% Physical Exam  Constitutional: He is oriented to person, place, and time. He appears well-developed and well-nourished.  HENT:  Head: Normocephalic and atraumatic.  Eyes: Conjunctivae and EOM are normal. Pupils are equal, round, and reactive to light. Right eye exhibits no discharge. Left eye exhibits no discharge.  Neck: Normal range of motion. Neck supple.  Cardiovascular: Regular rhythm and normal heart sounds.  Tachycardia present.  Exam reveals no friction rub.   No murmur heard. Pulses:      Radial  pulses are 2+ on the right side, and 2+ on the left side.       Dorsalis pedis pulses are 2+ on the right side, and 2+ on the left side.  Pulmonary/Chest: Effort normal and breath sounds normal. No respiratory distress. He has no wheezes. He has no rales. He exhibits no tenderness.  Abdominal: Soft. Bowel sounds are normal. He exhibits no distension. There is no tenderness. There is no rebound and no guarding.  Genitourinary:  Rectal exam: Negative swelling, erythema, inflammation, lesions, sores, deformities, hemorrhoids identified to the anus. Black tarry stool identified around the anus region. Strong sphincter tone. Dark black stools noted on glove, negative bright red blood. Exam chaperoned with nurse, Martie Round.   Musculoskeletal: Normal range of motion.  Neurological: He is alert and oriented to person, place, and time. No cranial nerve deficit. He exhibits normal muscle tone. Coordination normal. GCS eye subscore is 4. GCS verbal subscore is 5. GCS motor subscore is 6.  Skin: He is not diaphoretic. There is pallor.  Psychiatric: He has a normal mood and affect. His behavior is normal. Thought content normal.  Nursing note and vitals reviewed.   ED Course  Procedures (including critical care time)  Results for orders placed or performed during the hospital encounter of 01/21/15  CBC with Differential  Result Value Ref Range   WBC 12.3 (H) 4.0 - 10.5 K/uL   RBC 2.35 (L) 4.22 - 5.81 MIL/uL   Hemoglobin 6.2 (LL) 13.0 - 17.0 g/dL   HCT 19.5 (L) 39.0 - 52.0 %   MCV 83.0 78.0 - 100.0 fL   MCH 26.4 26.0 - 34.0 pg   MCHC 31.8 30.0 - 36.0 g/dL   RDW 13.6 11.5 - 15.5 %   Platelets 378 150 - 400 K/uL   Neutrophils Relative % 82 (H) 43 - 77 %   Neutro Abs 10.1 (H) 1.7 - 7.7 K/uL   Lymphocytes Relative 13 12 - 46 %   Lymphs Abs 1.7 0.7 - 4.0 K/uL   Monocytes Relative 4 3 - 12 %   Monocytes Absolute 0.5 0.1 - 1.0 K/uL   Eosinophils Relative 0 0 - 5 %   Eosinophils Absolute 0.0 0.0 - 0.7  K/uL   Basophils Relative 0 0 - 1 %   Basophils Absolute 0.0 0.0 - 0.1 K/uL  Comprehensive metabolic panel  Result Value Ref Range   Sodium 136 135 - 145 mmol/L   Potassium 3.6 3.5 - 5.1 mmol/L   Chloride 103 101 - 111 mmol/L   CO2 23 22 - 32 mmol/L   Glucose, Bld 106 (H) 65 - 99 mg/dL   BUN 25 (H) 6 - 20 mg/dL   Creatinine, Ser 0.79 0.61 - 1.24 mg/dL   Calcium 8.4 (L) 8.9 - 10.3 mg/dL   Total Protein 5.4 (L) 6.5 - 8.1 g/dL   Albumin 3.4 (L) 3.5 - 5.0 g/dL   AST 18 15 - 41 U/L   ALT 21 17 - 63 U/L   Alkaline Phosphatase 55 38 - 126 U/L   Total Bilirubin 0.4 0.3 - 1.2 mg/dL  GFR calc non Af Amer >60 >60 mL/min   GFR calc Af Amer >60 >60 mL/min   Anion gap 10 5 - 15  Urinalysis, Routine w reflex microscopic  Result Value Ref Range   Color, Urine YELLOW YELLOW   APPearance CLEAR CLEAR   Specific Gravity, Urine 1.023 1.005 - 1.030   pH 6.0 5.0 - 8.0   Glucose, UA NEGATIVE NEGATIVE mg/dL   Hgb urine dipstick NEGATIVE NEGATIVE   Bilirubin Urine NEGATIVE NEGATIVE   Ketones, ur NEGATIVE NEGATIVE mg/dL   Protein, ur NEGATIVE NEGATIVE mg/dL   Urobilinogen, UA 0.2 0.0 - 1.0 mg/dL   Nitrite NEGATIVE NEGATIVE   Leukocytes, UA NEGATIVE NEGATIVE  POC occult blood, ED  Result Value Ref Range   Fecal Occult Bld POSITIVE (A) NEGATIVE  CBG monitoring, ED  Result Value Ref Range   Glucose-Capillary 109 (H) 65 - 99 mg/dL  Type and screen  Result Value Ref Range   ABO/RH(D) A POS    Antibody Screen NEG    Sample Expiration 01/24/2015    Unit Number [id number]    Blood Component Type RBC, LR IRR    Unit division 00    Status of Unit ISSUED    Transfusion Status OK TO TRANSFUSE    Crossmatch Result Compatible   Prepare RBC  Result Value Ref Range   Order Confirmation ORDER PROCESSED BY BLOOD BANK   ABO/Rh  Result Value Ref Range   ABO/RH(D) A POS     Labs Review Labs Reviewed  CBC WITH DIFFERENTIAL/PLATELET - Abnormal; Notable for the following:    WBC 12.3 (*)    RBC  2.35 (*)    Hemoglobin 6.2 (*)    HCT 19.5 (*)    Neutrophils Relative % 82 (*)    Neutro Abs 10.1 (*)    All other components within normal limits  COMPREHENSIVE METABOLIC PANEL - Abnormal; Notable for the following:    Glucose, Bld 106 (*)    BUN 25 (*)    Calcium 8.4 (*)    Total Protein 5.4 (*)    Albumin 3.4 (*)    All other components within normal limits  POC OCCULT BLOOD, ED - Abnormal; Notable for the following:    Fecal Occult Bld POSITIVE (*)    All other components within normal limits  CBG MONITORING, ED - Abnormal; Notable for the following:    Glucose-Capillary 109 (*)    All other components within normal limits  URINALYSIS, ROUTINE W REFLEX MICROSCOPIC (NOT AT [facility])  TYPE AND SCREEN  PREPARE RBC (CROSSMATCH)  ABO/RH    Imaging Review No results found.   EKG Interpretation None       1:28 PM This provider spoke with Azucena Freed, PA-C. Discussed case in great detail. Recommended patient to be given IV protonic. Will see and admission.  MDM   Final diagnoses:  Gastrointestinal hemorrhage, unspecified gastritis, unspecified gastrointestinal hemorrhage type  Low hemoglobin  Melena    Medications  pantoprazole (PROTONIX) injection 40 mg (not administered)  sodium chloride 0.9 % bolus 1,000 mL (0 mLs Intravenous Stopped 01/21/15 1328)  0.9 %  sodium chloride infusion (10 mL/hr Intravenous New Bag/Given 01/21/15 1327)    Filed Vitals:   01/21/15 1212 01/21/15 1235 01/21/15 1300 01/21/15 1322  BP: 113/59 112/73 118/71 112/60  Pulse: 107 135 111 99  Temp:    99.4 F (37.4 C)  TempSrc:    Oral  Resp: 23 18 17 21   Height:  Weight:      SpO2: 100% 100% 100% 100%   CBC noted elevated white blood cell count of 12.3. Hemoglobin 6.2, hematocrit 19.5. CMP unremarkable. CBG 109. Fecal occult positive. Urinalysis negative for hemoglobin, nitrites, leukocytes. Patient presenting to the ED with a hemoglobin of 6.2 with Maloney stools that is been ongoing  for the past 3 days. Patient given 1 unit of packed red blood cells in ED setting, started on IV protonic. GI consulted and will see patients. Patient to be admitted for low hemoglobin and for blood transfusion. Patient will need further workup in the hospital. Discussed case in great detail with the [facility] hospitalist. [facility] to admit patient. Patient appears to be tachycardic with a heart rate of 111 bpm. Patient is afebrile, not hypotensive. Pulse ox 100% on room air. Discussed plan for admission with patient who agrees to plan of care.   Jamse Mead, PA-C 01/21/15 1347  Tanna Furry, MD 01/29/15 [phone]

## 2015-01-21 NOTE — ED Notes (Addendum)
Pt c/o blood in stool, SOB, heart racing, black tarry stool, nausea and vomiting x 3 days. He reports taking 4 ASA 325mg  through out the day before s/s onset for a HA.

## 2015-01-21 NOTE — ED Notes (Signed)
Patient could not tolerate standing up.

## 2015-01-21 NOTE — ED Notes (Signed)
CMP cancelled. Patient sent to ER

## 2015-01-21 NOTE — Op Note (Signed)
[facility] [place], [id number]   ENDOSCOPY PROCEDURE REPORT  PATIENT: Leonard Lucas, Leonard Lucas  MR#: [id number] BIRTHDATE: 10/08/1990 , 23  yrs. old GENDER: male ENDOSCOPIST: Lafayette Dragon, MD REFERRED BY:  Hospitalist service PROCEDURE DATE:  01/21/2015 PROCEDURE:  EGD w/ biopsy ASA CLASS:     Class II INDICATIONS:  melena and Hgb 6.2, Pt taking Eccedrin for headaches, 4/day, also takes TUMs,. MEDICATIONS: Fentanyl 50 mcg IV, Benadryl 25 mg IV, and Versed 5 mg IV TOPICAL ANESTHETIC: Cetacaine Spray  DESCRIPTION OF PROCEDURE: After the risks benefits and alternatives of the procedure were thoroughly explained, informed consent was obtained.  The Pentax Gastroscope [id number] endoscope was introduced through the mouth and advanced to the second portion of the duodenum , Without limitations.  The instrument was slowly withdrawn as the mucosa was fully examined.      ESOPHAGUS: The mucosa of the esophagus appeared normal.  STOMACH: Multiple 74mm erosions were found in the gastric antrum. Cold forcep biopsies were taken at the antrum and angularis to evaluate for h.  pylori. There was no blood in the stomach  DUODENUM: Two non-bleeding ranging between 5-69mm in size non-bleeding, clean-based, round and shallow ulcers were found in the bulb and 2nd part of the duodenum. Duodenal buld was distensible and not contrasted or deformed  Retroflexed views revealed no abnormalities.     The scope was then withdrawn from the patient and the procedure completed.  COMPLICATIONS: There were no immediate complications.  ENDOSCOPIC IMPRESSION: 1.   The mucosa of the esophagus appeared normal 2.   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   Two non-bleeding duodenal ulcers ranging between 5-76mm in size were found in the bulb and 2nd part of the duodenum  , there  was no  visible vessel, no blood collection in the duodenum  RECOMMENDATIONS: 1.  Await pathology results 2.  Clear liquids 3.Close follow up of H&H 4 .avoid ASA and NSAID products 5. pt should see a neurologist to evaluate headaches 6. will likely need outpatien Iron supplements temporarily  REPEAT EXAM: for EGD pending biopsy results.  eSigned:  Lafayette Dragon, MD 01/21/2015 6:04 PM    CC:  PATIENT NAME:  [name], [name] MR#: [id number]

## 2015-01-21 NOTE — Progress Notes (Signed)
UR COMPLETED  

## 2015-01-21 NOTE — ED Notes (Signed)
Pt presents to department for evaluation of rectal bleeding since Saturday. Reports dark tarry stools. Was seen at PCP today, hemoglobin of 6.1, sent to ED for further evaluation of possible blood transfusion. Pt is alert and oriented x4. Pale upon arrival.

## 2015-01-21 NOTE — H&P (Signed)
[facility] History and Physical                                                                                    Leonard Lucas, is a 24 y.o. male  MRN: [id number]   DOB - 1991/05/28  Admit Date - 01/21/2015  Outpatient Primary MD for the patient is Marcial Pacas, DO  Referring MD: Jeneen Rinks / ER  Consulting MD: Olevia Perches / Gastroenterology  With History of -  Past Medical History  Diagnosis Date  . Anxiety   . Depression       Past Surgical History  Procedure Laterality Date  . Wisdom tooth extraction  08/2010    in for   Chief Complaint  Patient presents with  . Rectal Bleeding     HPI This is a 24 year old male, history of migraines, major depression and social anxiety disorder on and up to present medications. Presented to ER after developing GI bleeding symptoms. Patient began having abdominal pain and bright red blood per rectum late Saturday evening. Symptoms have continued since that time. He has had nonbloody emesis and persistent nausea. His had generalized diffuse abdominal pain. Bright red blood has ceased but patient now with melanoma. Patient regular takes Excedrin migraine at least 2 per day and occasionally takes Pepcid. No bright red blood noticed today. In ER patient had systolic blood pressure greater than 90 but was somewhat orthostatic when checked. Room air sats 100%. He was afebrile. He was monitored tachycardic but heart rate less than 120. BUN was 25 creatinine 0.79. White count was 12,300 and hemoglobin was 6.2 with an MCV of 83. Gastroenterology has been consulted by the ER physician. The ER has ordered 1 unit of packed red blood cells. Confirmed fecal occult blood positive.   Review of Systems   In addition to the HPI above,  No Fever-chills, myalgias or other constitutional symptoms No changes with Vision or hearing, new weakness, tingling, numbness in any extremity, No problems swallowing food or Liquids, indigestion/reflux No Chest pain, Cough  or Shortness of Breath, palpitations, orthopnea or DOE No dysuria, hematuria or flank pain No new skin rashes, lesions, masses or bruises, No new joints pains-aches No recent weight gain or loss No polyuria, polydypsia or polyphagia,  *A full 10 point Review of Systems was done, except as stated above, all other Review of Systems were negative.  Social History History  Substance Use Topics  . Smoking status: Never Smoker   . Smokeless tobacco: Not on file  . Alcohol Use: No    Resides at: Private residence  Lives with: Home with parents; attends [employer] and is studying IT  Ambulatory status: w/o assistive devices   Family History Family History  Problem Relation Age of Onset  . Breast cancer Mother   . Depression Mother   . Depression Paternal Aunt      Prior to Admission medications   Medication Sig Start Date End Date Taking? Authorizing Provider  aspirin-acetaminophen-caffeine (EXCEDRIN MIGRAINE) [phone] MG per tablet Take 2 tablets by mouth every 6 (six) hours as needed for headache.   Yes Historical Provider, MD  FLUoxetine (PROZAC) 40 MG capsule Take 1 capsule (  40 mg total) by mouth daily. 12/17/14  Yes [name], MD  Loratadine 10 MG CAPS Take 10 mg by mouth.   Yes Historical Provider, MD  famotidine (PEPCID) 20 MG tablet Take 1 tablet (20 mg total) by mouth 2 (two) times daily. Patient not taking: Reported on 01/21/2015 03/09/14   Jacqulyn Cane, MD    No Known Allergies  Physical Exam  Vitals  Blood pressure 109/67, pulse 100, temperature 99.1 F (37.3 C), temperature source Oral, resp. rate 20, height 5\' 8"  (1.727 m), weight 240 lb (108.863 kg), SpO2 100 %.   General:  In no acute distress, appears very pale  Psych:  Normal affect, Denies Suicidal or Homicidal ideations, Awake Alert, Oriented X 3. Speech and thought patterns are clear and appropriate, no apparent short term memory deficits  Neuro:   No focal neurological deficits, CN II through XII  intact, Strength 5/5 all 4 extremities, Sensation intact all 4 extremities.  ENT:  Ears and Eyes appear Normal, Conjunctivae clear, PER. Moist oral mucosa without erythema or exudates.  Neck:  Supple, No lymphadenopathy appreciated  Respiratory:  Symmetrical chest wall movement, Good air movement bilaterally, CTAB. Room Air  Cardiac:  RRR, No Murmurs, no LE edema noted, no JVD, No carotid bruits, peripheral pulses palpable at 2+  Abdomen:  Positive bowel sounds, Soft, diffusely tender minimal guarding but no rebounding, Non distended,  No masses appreciated, no obvious hepatosplenomegaly  Skin:  No Cyanosis, Normal Skin Turgor, No Skin Rash or Bruise.  Extremities: Symmetrical without obvious trauma or injury,  no effusions.  Data Review  CBC  Recent Labs Lab 01/21/15 1124  WBC 12.3*  HGB 6.2*  HCT 19.5*  PLT 378  MCV 83.0  MCH 26.4  MCHC 31.8  RDW 13.6  LYMPHSABS 1.7  MONOABS 0.5  EOSABS 0.0  BASOSABS 0.0    Chemistries   Recent Labs Lab 01/21/15 1124  NA 136  K 3.6  CL 103  CO2 23  GLUCOSE 106*  BUN 25*  CREATININE 0.79  CALCIUM 8.4*  AST 18  ALT 21  ALKPHOS 55  BILITOT 0.4    estimated creatinine clearance is 171.8 mL/min (by C-G formula based on Cr of 0.79).  No results for input(s): TSH, T4TOTAL, T3FREE, THYROIDAB in the last 72 hours.  Invalid input(s): FREET3  Coagulation profile No results for input(s): INR, PROTIME in the last 168 hours.  No results for input(s): DDIMER in the last 72 hours.  Cardiac Enzymes No results for input(s): CKMB, TROPONINI, MYOGLOBIN in the last 168 hours.  Invalid input(s): CK  Invalid input(s): POCBNP  Urinalysis    Component Value Date/Time   COLORURINE YELLOW 01/21/2015 [place] 01/21/2015 1247   LABSPEC 1.023 01/21/2015 1247   PHURINE 6.0 01/21/2015 1247   GLUCOSEU NEGATIVE 01/21/2015 1247   HGBUR NEGATIVE 01/21/2015 [place] 01/21/2015 1247   KETONESUR  NEGATIVE 01/21/2015 1247   PROTEINUR NEGATIVE 01/21/2015 1247   UROBILINOGEN 0.2 01/21/2015 1247   NITRITE NEGATIVE 01/21/2015 1247   LEUKOCYTESUR NEGATIVE 01/21/2015 1247    Imaging results:   No results found.   EKG: (Independently reviewed) sinus rhythm with inverted T waves in multiple leads with no previous EKG for comparison noting patient not having any chest pain and age 12, does have short PR interval   Assessment & Plan  Principal Problem:   GI bleed -Admit to stepdown -GI consultation pending but anticipate EGD during hospitalization -Nothing by mouth -IV fluids -  Suspect secondary to overuse of NSAIDs -Has low-grade leukocytosis and setting of GI bleeding -Was given Protonix 40 mg IV 1 dose and ER; begin Protonix infusion  Active Problems:   Symptomatic anemia -Hemoglobin quite low at 6.2 with no prior hemoglobin for comparison -ER physician has ordered 1 unit of packed red blood cells and I will order a second unit with CBC check after transfusion complete -Repeat CBC in a.m.    Generalized abdominal pain -Secondary to acute GI bleeding -Symptom management    History of migraine -Discussed with patient and mother likelihood of meaning to use non-aspirin products at least for the next 12 months to treat migraine    Depression, major, recurrent, moderate/ Social anxiety disorder -Resume home medications once oral intake allowed   DVT Prophylaxis: SCDs  Family Communication:   Mother at bedside  Code Status:  Full code  Condition:  Stable  Discharge disposition: Anticipate discharge within the next 24-48 hours pending EGD results and stabilization of hemoglobin without evidence of further/ongoing bleeding  Time spent in minutes : 60      Johnathan Tortorelli L. ANP on 01/21/2015 at 2:37 PM  Between 7am to 7pm - Pager - [phone]  After 7pm go to [email or web address] - password [email or web address]  And look for the night coverage person covering me after hours  [facility]

## 2015-01-21 NOTE — Consult Note (Signed)
[facility] Consult: 2:19 PM 01/21/2015     Referring Provider:  Jamse Mead, PA-C Primary Care Physician:  Marcial Pacas, DO Primary Gastroenterologist:  Candida Peeling assigned     Reason for Consultation:  GI bleed     HPI: Leonard Lucas is a 24 y.o. male.  Obese with fatty liver on ultrasound 2014. Major depression/anxiety. Carries a diagnosis of GERD but doesn't take medicine for this as it is generally asymptomatic.Marland Kitchen He's never had an endoscopy or colonoscopy.   Beginning 01/18/15 the patient started having tarry, melenic stools. There was weakness with dyspnea on exertion but no dizziness or presyncopal symptoms. He had about 5 of the*he stools on Saturday, 4 on Sunday.  By Monday the volume of stool had slowed down and was less frequent but still black. He still had small black stool this morning. He felt so weak and short of breath that he decided to come to the emergency room this morning. He's had a couple of isolated episodes of emesis, one on Saturday and the other was today. On Saturday the vomit looked like partially digested food, today it looks like clear bile. He does not see any coffee grounds or colorectal looking or bloody emesis.Marland Kitchen   He took some Imodium on Sunday. Appetite is decreased but between the episodes of vomiting, he has tolerated solids as well as liquids but has tended towards a bland diet. No abdominal pain.    Hgb is 6.2. MCV normal. BUN 25. FOBT +.    Past Medical History  Diagnosis Date  . Anxiety   . Depression     Past Surgical History  Procedure Laterality Date  . Wisdom tooth extraction  08/2010    Prior to Admission medications   Medication Sig Start Date End Date Taking? Authorizing Provider  aspirin-acetaminophen-caffeine (EXCEDRIN MIGRAINE) [phone] MG per tablet  Take 2 tablets by mouth every 6 (six) hours as needed for headache.   Yes Historical Provider, MD  FLUoxetine (PROZAC) 40 MG capsule Take 1 capsule (40 mg total) by mouth daily. 12/17/14  Yes [name], MD  Loratadine 10 MG CAPS Take 10 mg by mouth.   Yes Historical Provider, MD  famotidine (PEPCID) 20 MG tablet Take 1 tablet (20 mg total) by mouth 2 (two) times daily. Patient not taking: Reported on 01/21/2015 03/09/14   Jacqulyn Cane, MD    Scheduled Meds:   Infusions: . sodium chloride    . pantoprozole (PROTONIX) infusion     PRN Meds:    Allergies as of 01/21/2015  . (No Known Allergies)    Family History  Problem Relation Age of Onset  . Breast cancer Mother   . Depression Mother   . Depression Paternal Aunt     History   Social History  . Marital Status: Single    Spouse Name: N/A  . Number of Children: N/A  . Years of Education: N/A   Occupational History  . Not on file.   Social History Main Topics  . Smoking status: Never Smoker   . Smokeless tobacco: Not  on file  . Alcohol Use: No  . Drug Use: No  . Sexual Activity: No   Other Topics Concern  . Not on file   Social History Narrative    REVIEW OF SYSTEMS: Constitutional:  Stable weight. Weakness. ENT:  No nose bleeds Pulm:  Dyspnea on exertion which is new. No cough. CV:  No palpitations, no LE edema.  No chest pain GU:  No hematuria, no frequency GI:  Per HPI Heme:  No previous history of anemia. No other unusual bleeding besides the GI bleeding described in history of present illness.  Transfusions:  None in the past.  Neuro:  patient's headaches are not associated with visual changes, nor with nausea or vomiting. He has never talked to his doctor about the headaches and has never seen a headache specialist.  Derm:  No itching, no rash or sores.  Endocrine:  No sweats or chills.  No polyuria or dysuria Immunization:  Did not inquire  Travel:  None beyond local counties in last few months.     PHYSICAL EXAM: Vital signs in last 24 hours: Filed Vitals:   01/21/15 1400  BP: 119/71  Pulse: 93  Temp:   Resp: 21   Wt Readings from Last 3 Encounters:  01/21/15 240 lb (108.863 kg)  12/17/14 251 lb (113.853 kg)  10/17/14 248 lb (112.492 kg)    General: Obese, pale but generally well-appearing WM. Head:  No swelling, no asymmetry, no signs of head trauma.   Eyes:  No icterus. Conjunctiva is pale.  Ears:  Hearing intact without obvious deficit.   Nose:  No congestion or discharge.  Mouth:  Clear. Dentition in good repair. Mucous membranes pink, moist, clear.  Neck:  No JVD, no masses, no TMG Pulm:  No dyspnea, no cough.  Heart: RRR. No MRG. S1/S2 audible.  Abdomen:  Soft, NT, ND.  No HSM. No mass, no bruits, no hernias.   Rectal: Deferred. This was done by ED provider and revealed melena   Musc/Skeltl: No joint contractures, swelling or erythema.  Extremities:  No CCE.   Neurologic:  Oriented 3. Alert. No tremors, no limb weakness. No gross neurologic deficits. Tattoos:  None seen Nodes:  no cervical adenopathy.   Psych:  pleasant, cooperative, relaxed.  Intake/Output from previous day:   Intake/Output this shift:    LAB RESULTS:  Recent Labs  01/21/15 1124  WBC 12.3*  HGB 6.2*  HCT 19.5*  PLT 378   BMET Lab Results  Component Value Date   NA 136 01/21/2015   K 3.6 01/21/2015   CL 103 01/21/2015   CO2 23 01/21/2015   GLUCOSE 106* 01/21/2015   BUN 25* 01/21/2015   CREATININE 0.79 01/21/2015   CALCIUM 8.4* 01/21/2015   LFT  Recent Labs  01/21/15 1124  PROT 5.4*  ALBUMIN 3.4*  AST 18  ALT 21  ALKPHOS 55  BILITOT 0.4   PT/INR No results found for: INR, PROTIME  Drugs of Abuse  No results found for: LABOPIA, COCAINSCRNUR, LABBENZ, AMPHETMU, THCU, LABBARB   RADIOLOGY STUDIES: No results found.  ENDOSCOPIC STUDIES: None ever.  IMPRESSION:   *  UGI bleed.  R/o ulcer  *  ABL anemia. Receiving 1 of 1 unit of PRBCs now.   *   Frequent headaches.  Frequent, but not daily and not excessive ASA.     PLAN:     * EGD  *  Finish transfusion as started  *  PPI drip, already initiated.  Azucena Freed  01/21/2015, 2:19 PM Pager: [phone]  Attending MD note:   I have taken a history, examined the patient, and reviewed the chart. I agree with the Advanced Practitioner's impression and recommendations. I agree with EGD. Subacute UGIB, BUN 25, /creat .79. Suspect DU or GU.  See EGDS note.  [facility] Pager # [phone]

## 2015-01-21 NOTE — ED Provider Notes (Signed)
CSN: [id number]     Arrival date & time 01/21/15  9767 History   First MD Initiated Contact with Patient 01/21/15 0935     Chief Complaint  Patient presents with  . Rectal Bleeding  . Shortness of Breath   (Consider location/radiation/quality/duration/timing/severity/associated sxs/prior Treatment) Patient is a 24 y.o. male presenting with hematochezia and shortness of breath. The history is provided by the patient. No language interpreter was used.  Rectal Bleeding Quality:  Black and tarry Amount:  Moderate Duration:  1 day Timing:  Constant Progression:  Worsening Chronicity:  New Context: diarrhea   Similar prior episodes: no   Relieved by:  Nothing Worsened by:  Nothing tried Ineffective treatments:  None tried Associated symptoms: dizziness and light-headedness   Associated symptoms: no abdominal pain   Risk factors: NSAID use   Shortness of Breath Associated symptoms: no abdominal pain   Pt took 4 asa 4 days ago.  Pt began having black stool.  Pt weak today  Past Medical History  Diagnosis Date  . Anxiety   . Depression    Past Surgical History  Procedure Laterality Date  . Wisdom tooth extraction  08/2010   Family History  Problem Relation Age of Onset  . Breast cancer Mother   . Depression Mother   . Depression Paternal Aunt    History  Substance Use Topics  . Smoking status: Never Smoker   . Smokeless tobacco: Not on file  . Alcohol Use: No    Review of Systems  Respiratory: Positive for shortness of breath.   Gastrointestinal: Positive for hematochezia. Negative for abdominal pain.  Neurological: Positive for dizziness and light-headedness.  All other systems reviewed and are negative.   Allergies  Review of patient's allergies indicates no known allergies.  Home Medications   Prior to Admission medications   Medication Sig Start Date End Date Taking? Authorizing Provider  famotidine (PEPCID) 20 MG tablet Take 1 tablet (20 mg total) by mouth  2 (two) times daily. 03/09/14   Jacqulyn Cane, MD  FLUoxetine (PROZAC) 40 MG capsule Take 1 capsule (40 mg total) by mouth daily. 12/17/14   [name], MD  Loratadine 10 MG CAPS Take 10 mg by mouth.    Historical Provider, MD   BP 109/79 mmHg  Pulse 115  Temp(Src) 98.9 F (37.2 C) (Oral)  Resp 20  SpO2 100% Physical Exam  Constitutional: He is oriented to person, place, and time. He appears well-developed and well-nourished.  pale  HENT:  Head: Normocephalic.  Eyes: EOM are normal. Pupils are equal, round, and reactive to light.  Pale sclera  Neck: Normal range of motion.  Cardiovascular: Normal heart sounds.   tachycardai  Pulmonary/Chest: Effort normal.  Abdominal: He exhibits no distension.  Musculoskeletal: Normal range of motion.  Neurological: He is alert and oriented to person, place, and time.  Psychiatric: He has a normal mood and affect.  Nursing note and vitals reviewed.   ED Course  Procedures (including critical care time) Labs Review Labs Reviewed  POCT FASTING CBG [facility] MANUAL ENTRY - Abnormal; Notable for the following:    POCT Glucose ([facility]) 115 (*)    All other components within normal limits  COMPREHENSIVE METABOLIC PANEL  POCT CBC W AUTO DIFF ([place])    Imaging Review No results found.  Hemoglobin 5.9.Marland KitchenMarland Kitchen  I counseled pt and spoke with Father who will pick him up.   Pt is to go to [facility] for further evaluation.  I discussed possibility  of transfusion  MDM  No diagnosis found.     [place], PA-C 01/21/15 1016

## 2015-01-21 NOTE — Discharge Instructions (Signed)
GO to Leonard Lucas now.

## 2015-01-22 ENCOUNTER — Encounter (HOSPITAL_COMMUNITY): Payer: Self-pay | Admitting: Internal Medicine

## 2015-01-22 DIAGNOSIS — D62 Acute posthemorrhagic anemia: Secondary | ICD-10-CM

## 2015-01-22 DIAGNOSIS — K2900 Acute gastritis without bleeding: Secondary | ICD-10-CM

## 2015-01-22 DIAGNOSIS — D72829 Elevated white blood cell count, unspecified: Secondary | ICD-10-CM

## 2015-01-22 LAB — BASIC METABOLIC PANEL
Anion gap: 9 (ref 5–15)
BUN: 13 mg/dL (ref 6–20)
CO2: 23 mmol/L (ref 22–32)
CREATININE: 0.78 mg/dL (ref 0.61–1.24)
Calcium: 8.3 mg/dL — ABNORMAL LOW (ref 8.9–10.3)
Chloride: 104 mmol/L (ref 101–111)
GFR calc Af Amer: >60 mL/min (ref >60)
GFR calc non Af Amer: >60 mL/min (ref >60)
GLUCOSE: 104 mg/dL — AB (ref 65–99)
POTASSIUM: 3.5 mmol/L (ref 3.5–5.1)
Sodium: 136 mmol/L (ref 135–145)

## 2015-01-22 LAB — CBC
HEMATOCRIT: 22.4 % — AB (ref 39.0–52.0)
Hemoglobin: 7.3 g/dL — ABNORMAL LOW (ref 13.0–17.0)
MCH: 27.1 pg (ref 26.0–34.0)
MCHC: 32.6 g/dL (ref 30.0–36.0)
MCV: 83.3 fL (ref 78.0–100.0)
PLATELETS: 279 10*3/uL (ref 150–400)
RBC: 2.69 MIL/uL — AB (ref 4.22–5.81)
RDW: 14.1 % (ref 11.5–15.5)
WBC: 10.8 10*3/uL — ABNORMAL HIGH (ref 4.0–10.5)

## 2015-01-22 LAB — MRSA PCR SCREENING: MRSA by PCR: NEGATIVE

## 2015-01-22 MED ORDER — PSEUDOEPHEDRINE HCL 30 MG PO TABS
30.0000 mg | ORAL_TABLET | Freq: Once | ORAL | Status: AC
Start: 2015-01-22 — End: 2015-01-22
  Administered 2015-01-22: 30 mg via ORAL
  Filled 2015-01-22: qty 1

## 2015-01-22 MED ORDER — OXYMETAZOLINE HCL 0.05 % NA SOLN
1.0000 | Freq: Two times a day (BID) | NASAL | Status: DC
  Administered 2015-01-22 – 2015-01-23 (×3): 1 via NASAL
  Filled 2015-01-22: qty 15

## 2015-01-22 MED ORDER — OXYCODONE-ACETAMINOPHEN 5-325 MG PO TABS
1.0000 | ORAL_TABLET | Freq: Four times a day (QID) | ORAL | Status: DC | PRN
  Administered 2015-01-22: 1 via ORAL
  Filled 2015-01-22: qty 1

## 2015-01-22 NOTE — Progress Notes (Addendum)
Patient called RN into room. Patient informed this RN that he is upset that we have not found a relief for his sinus pressure/headache and state he wants to leave. Risks of leaving against medical advice explained to patient and his father who was visiting. I discussed with patient that multiple treatments have been tried and that the MD can be called to try to find another alternative treatments. Patient agreed to stay for now but would like to try sudafed as that has worked in the past for him. Dr. Charlies Silvers made aware of the situation. New orders received. Will continue to monitor.

## 2015-01-22 NOTE — Care Management Note (Addendum)
Case Management Note  Patient Details  Name: Leonard Lucas MRN: [id number] Date of Birth: 04-18-1991  Subjective/Objective:                  Pt admitted from home with history of migraine, presents with complaints of shortness of breath, lightheadedness, and fatigue.  In ED workup pt  was  tachycardic, hemoglobin was 6.2, and reports having melena for the last 2 days,  and  Excedrin use secondary to migraines.  Action/Plan: Return to home when medically stable. CM to f/u with d/c disposition.  Expected Discharge Date:                  Expected Discharge Plan:  Home/Self Care  In-House Referral:     Discharge planning Services  CM Consult  Post Acute Care Choice:    Choice offered to:     DME Arranged:    DME Agency:     HH Arranged:    HH Agency:     Status of Service:  In process, will continue to follow  Medicare Important Message Given:    Date Medicare IM Given:    Medicare IM give by:    Date Additional Medicare IM Given:    Additional Medicare Important Message give by:     If discussed at [place] of Stay Meetings, dates discussed:    Additional CommentsSelinda Eon (mom) [phone] Sharin Mons, RN 01/22/2015, 10:45 AM

## 2015-01-22 NOTE — Progress Notes (Addendum)
Patient ID: Leonard Lucas, male   DOB: 01/15/1991, 24 y.o.   MRN: [id number] [facility]ESS [facility]  Leonard Lucas NIO:[id number] DOB: 20-Sep-1990 DOA: 01/21/2015 PCP: Iran Planas, PA-C  Brief narrative:    24 year old male, history of migraines, major depression who presented to [facility] ED with abdominal pain and bright red blood per rectum started few days prior to this admission. Pt also had non bloody vomiting. Pt regularly took Excedrin for headaches. On admission, SBP in 90's and apparently pt orthostatic on admission. Hemoglobin was 6.2 on admission. He was seen by GI in consultation. He is status post EGD with findings of multiple 4 mm erosions in the gastric antrum c/w erosive gastritis.Two non-bleeding duodenal ulcers ranging between 5-9 mm in size were also found in the bulb and 2nd part of the duodenum with no blood collection evident.   Assessment/Plan:    Principal Problem: Upper GI bleed / Erosive gastritis / Acute blood loss anemia - In the setting of Excedrin use - As mentioned above, pt is s/p EGD with findings of multiple 4 mm erosions in the gastric antrum c/w erosive gastritis.Two non-bleeding duodenal ulcers ranging between 5-9 mm in size were also found in the bulb and 2nd part of the duodenum with no blood collection evident. - Continue protonix drip - Pt is status post 2 units of PRBC transfusion - Continue to monitor CBC daily - Continue to monitor in SDU while pt on protonix drip - Continue supportive care with low rate IV fluids - Diet as tolerated   Active Problems: Leukocytosis - Likely reactive from acute GI bleed - No evidence of infection    DVT Prophylaxis  - SCD's bilaterally   Code Status: Full.  Family Communication:  plan of care discussed with the patient Disposition Plan: On protonix drip, remains in SDU.   IV access:  Peripheral IV  Procedures and diagnostic studies:    EGD 01/21/2015   Medical Consultants:   Gastroenterology  Other Consultants:  None  IAnti-Infectives:   None    Leisa Lenz, MD  [facility] Pager [phone]  Time spent in minutes: 25 minutes  If 7PM-7AM, please contact night-coverage [email or web address] Password [facility] 01/22/2015, 2:37 PM   LOS: 1 day    HPI/Subjective: No acute overnight events. Patient reports no nausea or vomiting. No shortness of breath.   Objective: Filed Vitals:   01/22/15 0400 01/22/15 0429 01/22/15 0734 01/22/15 1133  BP:   100/64 109/58  Pulse: 80  87 84  Temp:  98 F (36.7 C) 97.9 F (36.6 C) 98 F (36.7 C)  TempSrc:  Oral Oral Oral  Resp: 19  17 19   Height:      Weight:      SpO2: 98%  100% 100%    Intake/Output Summary (Last 24 hours) at 01/22/15 1437 Last data filed at 01/22/15 1200  Gross per 24 hour  Intake   1608 ml  Output   2275 ml  Net   -667 ml    Exam:   General:  Pt is alert, follows commands appropriately, not in acute distress; pale skin  Cardiovascular: Regular rate and rhythm, S1/S2, no murmurs  Respiratory: Clear to auscultation bilaterally, no wheezing, no crackles, no rhonchi  Abdomen: Soft, non tender, non distended, bowel sounds present  Extremities: No edema, pulses DP and PT palpable bilaterally  Neuro: Grossly nonfocal  Data Reviewed: Basic Metabolic Panel:  Recent Labs Lab 01/21/15 1124 01/22/15 0238  NA 136 136  K 3.6  3.5  CL 103 104  CO2 23 23  GLUCOSE 106* 104*  BUN 25* 13  CREATININE 0.79 0.78  CALCIUM 8.4* 8.3*   Liver Function Tests:  Recent Labs Lab 01/21/15 1124  AST 18  ALT 21  ALKPHOS 55  BILITOT 0.4  PROT 5.4*  ALBUMIN 3.4*   No results for input(s): LIPASE, AMYLASE in the last 168 hours. No results for input(s): AMMONIA in the last 168 hours. CBC:  Recent Labs Lab 01/21/15 1124 01/21/15 2331 01/22/15 0238  WBC 12.3* 12.2* 10.8*  NEUTROABS 10.1*  --   --   HGB 6.2* 7.3* 7.3*  HCT 19.5* 21.8* 22.4*  MCV 83.0 83.2 83.3  PLT 378 265 279    Cardiac Enzymes: No results for input(s): CKTOTAL, CKMB, CKMBINDEX, TROPONINI in the last 168 hours. BNP: Invalid input(s): POCBNP CBG:  Recent Labs Lab 01/21/15 1234  GLUCAP 109*    MRSA PCR Screening     Status: None   Collection Time: 01/21/15  9:48 PM  Result Value Ref Range Status   MRSA by PCR NEGATIVE NEGATIVE Final     Scheduled Meds: . sodium chloride  3 mL Intravenous Q12H   Continuous Infusions: . sodium chloride 20 mL/hr at 01/22/15 0700  . pantoprozole (PROTONIX) infusion 8 mg/hr (01/22/15 1104)

## 2015-01-22 NOTE — Progress Notes (Signed)
Family at bedside. They are requesting to speak tot he MD regarding patient. MD made aware. Dr. Charlies Silvers called and spoke to the patients mother over the phone. New orders given. Will continue to monitor.

## 2015-01-22 NOTE — Progress Notes (Signed)
On initial assessment, RN noted differing pupil sizes (Right 72mm and Left 70mm).  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  The pt stated he does not have a history of differing pupil sizes.  The RN notified K.Kirby.  No new orders received.  Pt is stable and is not showing signs of acute distress.  Will continue to monitor pt.

## 2015-01-23 ENCOUNTER — Encounter: Payer: Self-pay | Admitting: Internal Medicine

## 2015-01-23 ENCOUNTER — Telehealth: Payer: Self-pay | Admitting: *Deleted

## 2015-01-23 DIAGNOSIS — J329 Chronic sinusitis, unspecified: Secondary | ICD-10-CM

## 2015-01-23 LAB — CBC
HCT: 20.9 % — ABNORMAL LOW (ref 39.0–52.0)
Hemoglobin: 6.8 g/dL — CL (ref 13.0–17.0)
MCH: 27.6 pg (ref 26.0–34.0)
MCHC: 32.5 g/dL (ref 30.0–36.0)
MCV: 85 fL (ref 78.0–100.0)
PLATELETS: 294 10*3/uL (ref 150–400)
RBC: 2.46 MIL/uL — ABNORMAL LOW (ref 4.22–5.81)
RDW: 14.6 % (ref 11.5–15.5)
WBC: 7.1 10*3/uL (ref 4.0–10.5)

## 2015-01-23 LAB — PREPARE RBC (CROSSMATCH)

## 2015-01-23 LAB — HEMOGLOBIN AND HEMATOCRIT, BLOOD
HEMATOCRIT: 24.8 % — AB (ref 39.0–52.0)
Hemoglobin: 8.2 g/dL — ABNORMAL LOW (ref 13.0–17.0)

## 2015-01-23 MED ORDER — ACETAMINOPHEN 325 MG PO TABS
650.0000 mg | ORAL_TABLET | ORAL | Status: DC | PRN
  Administered 2015-01-23: 650 mg via ORAL
  Filled 2015-01-23: qty 2

## 2015-01-23 MED ORDER — SODIUM CHLORIDE 0.9 % IV SOLN
Freq: Once | INTRAVENOUS | Status: AC
  Administered 2015-01-23: 05:00:00 via INTRAVENOUS

## 2015-01-23 NOTE — Progress Notes (Signed)
CRITICAL VALUE ALERT  Critical value received:  Hemoglobin  Date of notification:  01/23/2015  Time of notification:  4:25  Critical value read back: yes  Nurse who received alert:  Josie Dixon  MD notified (1st page):  Tylene Fantasia  Time of page:  4:28

## 2015-01-23 NOTE — Progress Notes (Signed)
Patient ID: Leonard Lucas, male   DOB: April 24, 1991, 24 y.o.   MRN: [id number] [facility] PROGRESS NOTE  Leonard Lucas NOM:[id number] DOB: 12-31-1990 DOA: 01/21/2015 PCP: Iran Planas, PA-C  Brief narrative:    24 year old male, history of migraines, major depression who presented to [facility] ED with abdominal pain and bright red blood per rectum started few days prior to this admission. Pt also had non bloody vomiting. Pt regularly took Excedrin for headaches.  On admission, SBP in 90's and apparently pt orthostatic on admission. Hemoglobin was 6.2 on admission. He was seen by GI in consultation. He is status post EGD with findings of multiple 4 mm erosions in the gastric antrum c/w erosive gastritis.Two non-bleeding duodenal ulcers ranging between 5-9 mm in size were also found in the bulb and 2nd part of the duodenum with no blood collection evident.   Hemoglobin this am 6.8 so pt is receiving 1 unit of PRBC transfusion. Will check CBC in am and if stable then likely home tomorrow.   Assessment/Plan:    Principal Problem: Upper GI bleed / Erosive gastritis / Acute blood loss anemia - In the setting of Excedrin use versus current life stressors (mother and father divorced recently, lives with father who he does not get along well but plans to eventually move to [place] with his mom) - Pt is s/p EGD with findings of multiple 4 mm erosions in the gastric antrum c/w erosive gastritis.Two non-bleeding duodenal ulcers ranging between 5-9 mm in size were also found in the bulb and 2nd part of the duodenum with no blood collection. - Continue protonix drip - Hemoglobin dropped from 7.3 to 6.8 this am so he is receiving 1 unit PRBC transfusion this am. He already is status post 2 units of PRBC transfusion since admission. - Continue to monitor in SDU - Continue supportive care with low rate IV fluids - Diet as tolerated   Active Problems: Leukocytosis - Likely reactive from acute GI bleed - No  evidence of infection   Sinusitis - Order placed for low rate oxygen with humidifier and pt reported it helps   DVT Prophylaxis  - SCD's bilaterally   Code Status: Full.  Family Communication:  plan of care discussed with the patient and his mother  Disposition Plan: On protonix drip, also    IV access:  Peripheral IV  Procedures and diagnostic studies:    EGD 01/21/2015   Medical Consultants:  Gastroenterology  Other Consultants:  None  IAnti-Infectives:   None    Leisa Lenz, MD  [facility] Pager [phone]  Time spent in minutes: 25 minutes  If 7PM-7AM, please contact night-coverage [email or web address] Password [email or web address] 01/23/2015, 10:42 AM   LOS: 2 days    HPI/Subjective: No acute overnight events. Patient reports feeling tired.   Objective: Filed Vitals:   01/23/15 0534 01/23/15 0608 01/23/15 0700 01/23/15 0823  BP: 106/44 107/45  104/58  Pulse: 76   62  Temp: 98.6 F (37 C) 98.7 F (37.1 C) 98.1 F (36.7 C) 98.6 F (37 C)  TempSrc: Oral Oral Oral Oral  Resp: 18   18  Height:      Weight:      SpO2: 100%   100%    Intake/Output Summary (Last 24 hours) at 01/23/15 1042 Last data filed at 01/23/15 0823  Gross per 24 hour  Intake 3188.75 ml  Output   3650 ml  Net -461.25 ml    Exam:   General:  Pt is alert,  not in acute distress; pale skin  Cardiovascular: Rate controlled, S1/S2 appreciated   Respiratory: bilateral air entry, no wheezing  Abdomen: non tender, non distended abd, (+) BS  Extremities: No leg swelling, palpable pulses  Neuro: Nonfocal  Data Reviewed: Basic Metabolic Panel:  Recent Labs Lab 01/21/15 1124 01/22/15 0238  NA 136 136  K 3.6 3.5  CL 103 104  CO2 23 23  GLUCOSE 106* 104*  BUN 25* 13  CREATININE 0.79 0.78  CALCIUM 8.4* 8.3*   Liver Function Tests:  Recent Labs Lab 01/21/15 1124  AST 18  ALT 21  ALKPHOS 55  BILITOT 0.4  PROT 5.4*  ALBUMIN 3.4*   No results for input(s): LIPASE, AMYLASE  in the last 168 hours. No results for input(s): AMMONIA in the last 168 hours. CBC:  Recent Labs Lab 01/21/15 1124 01/21/15 2331 01/22/15 0238 01/23/15 0317  WBC 12.3* 12.2* 10.8* 7.1  NEUTROABS 10.1*  --   --   --   HGB 6.2* 7.3* 7.3* 6.8*  HCT 19.5* 21.8* 22.4* 20.9*  MCV 83.0 83.2 83.3 85.0  PLT 378 265 279 294   Cardiac Enzymes: No results for input(s): CKTOTAL, CKMB, CKMBINDEX, TROPONINI in the last 168 hours. BNP: Invalid input(s): POCBNP CBG:  Recent Labs Lab 01/21/15 1234  GLUCAP 109*    MRSA PCR Screening     Status: None   Collection Time: 01/21/15  9:48 PM  Result Value Ref Range Status   MRSA by PCR NEGATIVE NEGATIVE Final     Scheduled Meds: . oxymetazoline  1 spray Each Nare BID  . sodium chloride  3 mL Intravenous Q12H   Continuous Infusions: . sodium chloride 20 mL/hr at 01/23/15 0645  . pantoprozole (PROTONIX) infusion 8 mg/hr (01/23/15 0821)

## 2015-01-23 NOTE — Progress Notes (Signed)
Post Upper GI bleed from duodenal ulcerations. Biopsies negative for H.Pylori. Hgb continues to drift due to equilibration but BUN is back to normal.OK to advance diet.

## 2015-01-23 NOTE — Progress Notes (Signed)
Education discussed with pt about reducing stressors, no aspirin or NSAIDS,(ibuprofen/ excedrin, etc) why he is on clear diet at this time, labs, to avoid spicy foods to prevent GI irritation to ulcers, plan for iron tablets at discharge to help with increasing iron and blood levels and how iron makes stools black in color.  Pt informed that Dr Olevia Perches recommends pt to follow up as outpatient with a neurologist about his headaches.

## 2015-01-24 DIAGNOSIS — K2960 Other gastritis without bleeding: Secondary | ICD-10-CM | POA: Insufficient documentation

## 2015-01-24 DIAGNOSIS — D72829 Elevated white blood cell count, unspecified: Secondary | ICD-10-CM | POA: Insufficient documentation

## 2015-01-24 DIAGNOSIS — D62 Acute posthemorrhagic anemia: Secondary | ICD-10-CM | POA: Insufficient documentation

## 2015-01-24 LAB — TYPE AND SCREEN
ABO/RH(D): A POS
Antibody Screen: NEGATIVE
UNIT DIVISION: 0
UNIT DIVISION: 0
Unit division: 0

## 2015-01-24 LAB — CBC
HEMATOCRIT: 22.9 % — AB (ref 39.0–52.0)
Hemoglobin: 7.6 g/dL — ABNORMAL LOW (ref 13.0–17.0)
MCH: 28.5 pg (ref 26.0–34.0)
MCHC: 33.2 g/dL (ref 30.0–36.0)
MCV: 85.8 fL (ref 78.0–100.0)
PLATELETS: 301 10*3/uL (ref 150–400)
RBC: 2.67 MIL/uL — ABNORMAL LOW (ref 4.22–5.81)
RDW: 14.7 % (ref 11.5–15.5)
WBC: 8.7 10*3/uL (ref 4.0–10.5)

## 2015-01-24 MED ORDER — FERROUS SULFATE 325 (65 FE) MG PO TABS: 325.0000 mg | ORAL_TABLET | Freq: Every day | ORAL | Status: DC

## 2015-01-24 MED ORDER — PANTOPRAZOLE SODIUM 40 MG PO TBEC: 40.0000 mg | DELAYED_RELEASE_TABLET | Freq: Two times a day (BID) | ORAL | Status: DC

## 2015-01-24 NOTE — Discharge Instructions (Signed)
Anemia, Nonspecific Anemia is a condition in which the concentration of red blood cells or hemoglobin in the blood is below normal. Hemoglobin is a substance in red blood cells that carries oxygen to the tissues of the body. Anemia results in not enough oxygen reaching these tissues.  CAUSES  Common causes of anemia include:   Excessive bleeding. Bleeding may be internal or external. This includes excessive bleeding from periods (in women) or from the intestine.   Poor nutrition.   Chronic kidney, thyroid, and liver disease.  Bone marrow disorders that decrease red blood cell production.  Cancer and treatments for cancer.  HIV, AIDS, and their treatments.  Spleen problems that increase red blood cell destruction.  Blood disorders.  Excess destruction of red blood cells due to infection, medicines, and autoimmune disorders. SIGNS AND SYMPTOMS   Minor weakness.   Dizziness.   Headache.  Palpitations.   Shortness of breath, especially with exercise.   Paleness.  Cold sensitivity.  Indigestion.  Nausea.  Difficulty sleeping.  Difficulty concentrating. Symptoms may occur suddenly or they may develop slowly.  DIAGNOSIS  Additional blood tests are often needed. These help your health care provider determine the best treatment. Your health care provider will check your stool for blood and look for other causes of blood loss.  TREATMENT  Treatment varies depending on the cause of the anemia. Treatment can include:   Supplements of iron, vitamin B12, or folic acid.   Hormone medicines.   A blood transfusion. This may be needed if blood loss is severe.   Hospitalization. This may be needed if there is significant continual blood loss.   Dietary changes.  Spleen removal. HOME CARE INSTRUCTIONS Keep all follow-up appointments. It often takes many weeks to correct anemia, and having your health care provider check on your condition and your response to  treatment is very important. SEEK IMMEDIATE MEDICAL CARE IF:   You develop extreme weakness, shortness of breath, or chest pain.   You become dizzy or have trouble concentrating.  You develop heavy vaginal bleeding.   You develop a rash.   You have bloody or black, tarry stools.   You faint.   You vomit up blood.   You vomit repeatedly.   You have abdominal pain.  You have a fever or persistent symptoms for more than 2-3 days.   You have a fever and your symptoms suddenly get worse.   You are dehydrated.  MAKE SURE YOU:  Understand these instructions.  Will watch your condition.  Will get help right away if you are not doing well or get worse. Document Released: 09/16/2004 Document Revised: 04/11/2013 Document Reviewed: 02/02/2013 ExitCare Patient Information 2015 [employer]. This information is not intended to replace advice given to you by your health care provider. Make sure you discuss any questions you have with your health care provider.  

## 2015-01-24 NOTE — Progress Notes (Signed)
          Daily Rounding Note  01/24/2015, 8:12 AM  LOS: 3 days   SUBJECTIVE:       Tolerating full liquids.  No BM since arrival.  No nausea.  Feels stronger.   OBJECTIVE:         Vital signs in last 24 hours:    Temp:  [97.6 F (36.4 C)-98.6 F (37 C)] 98.2 F (36.8 C) (06/03 0408) Pulse Rate:  [62-97] 63 (06/03 0408) Resp:  [14-24] 18 (06/03 0408) BP: (98-117)/(54-76) 101/54 mmHg (06/03 0408) SpO2:  [99 %-100 %] 100 % (06/03 0408) Last BM Date: 01/20/15 Filed Weights   01/21/15 1128 01/21/15 2029  Weight: 240 lb (108.863 kg) 249 lb 1.9 oz (113 kg)   General: looks less pale.  Looks well   Heart: RRR Chest: clear.  No labored breathing or cough.  Abdomen: soft, NT, ND.  Active BS.   Extremities: no CCE Neuro/Psych:  Anxious, pleasant, no gross deficits, no tremors.   Intake/Output from previous day: 06/02 0701 - 06/03 0700 In: 1550 [P.O.:480; I.V.:735; Blood:335] Out: 7579 [Urine:1625]  Intake/Output this shift:    Lab Results:  Recent Labs  01/22/15 0238 01/23/15 0317 01/23/15 2039 01/24/15 0254  WBC 10.8* 7.1  --  8.7  HGB 7.3* 6.8* 8.2* 7.6*  HCT 22.4* 20.9* 24.8* 22.9*  PLT 279 294  --  301   BMET  Recent Labs  01/21/15 1124 01/22/15 0238  NA 136 136  K 3.6 3.5  CL 103 104  CO2 23 23  GLUCOSE 106* 104*  BUN 25* 13  CREATININE 0.79 0.78  CALCIUM 8.4* 8.3*   LFT  Recent Labs  01/21/15 1124  PROT 5.4*  ALBUMIN 3.4*  AST 18  ALT 21  ALKPHOS 55  BILITOT 0.4    Scheduled Meds: . oxymetazoline  1 spray Each Nare BID  . sodium chloride  3 mL Intravenous Q12H   Continuous Infusions: . sodium chloride 20 mL/hr at 01/24/15 0457  . pantoprozole (PROTONIX) infusion 8 mg/hr (01/24/15 0519)   PRN Meds:.acetaminophen, morphine injection, ondansetron **OR** ondansetron (ZOFRAN) IV, oxyCODONE-acetaminophen  ASSESMENT:   *  Melena/UGIB and ABL anemia EGD 5/31: gastric erosions, 2  non-bleeding D2 ulcers without VV.  Path: mild, chronic inactive gastritis, no H pylori.  S/p PRBCs x 3, including 1 yesterday. The Hgb overall represents good response to transfusion.  The higher level last night was obtained just after completing transfusion.  Remains on PPI drip.   *  Headaches.  Using moderate amounts Excedrin PTA   PLAN   *  Change to po Protonix (generic Omeprazole is ok) BID for 2 weeks, then q day.   *  Agree with once daily po iron. Should have CBC within next 10 to 14 days and again in 3 months  If anemia resolves, could stop po iron.   *  Agree with planned discharge today.;  Will d/w Dr Olevia Perches re: if she want GI ROV.  His phone is [phone].     Azucena Freed  01/24/2015, 8:12 AM Pager: [phone]

## 2015-01-24 NOTE — Discharge Summary (Signed)
Physician Discharge Summary  Leonard Lucas SAY:[id number] DOB: Dec 08, 1990 DOA: 01/21/2015  PCP: Iran Planas, PA-C  Admit date: 01/21/2015 Discharge date: 01/24/2015  Recommendations for Outpatient Follow-up:  1. Patient will follow-up with primary care physician in about one week after discharge to recheck hemoglobin  Discharge Diagnoses:  Principal Problem:   GI bleed Active Problems:   Generalized abdominal pain   Depression, major, recurrent, moderate   Social anxiety disorder   Symptomatic anemia   History of migraine   Duodenal ulcer hemorrhage   Bleeding gastrointestinal    Discharge Condition: stable   Diet recommendation: as tolerated   History of present illness:  24 year old male, history of migraines, major depression who presented to [facility] ED with abdominal pain and bright red blood per rectum started few days prior to this admission. Pt also had non bloody vomiting. Pt regularly took Excedrin for headaches.  On admission, SBP in 90's and apparently pt orthostatic on admission. Hemoglobin was 6.2 on admission. He was seen by GI in consultation. He is status post EGD with findings of multiple 4 mm erosions in the gastric antrum c/w erosive gastritis.Two non-bleeding duodenal ulcers ranging between 5-9 mm in size were also found in the bulb and 2nd part of the duodenum with no blood collection evident.   Hemoglobin was 6.8 on June 2 so patient has received additional units of PRBC transfusion. He is hemoglobin is 7.6 this morning. Patient insists on going home today. He will be discharged with iron supplementation.  Hospital Course:   Assessment/Plan:    Principal Problem: Upper GI bleed / Erosive gastritis / Acute blood loss anemia - In the setting of Excedrin use versus current life stressors (mother and father divorced recently) - Patient had endoscopy done 01/21/2015 with findings of multiple 4 mm erosions in the gastric antrum c/w erosive gastritis.Two  non-bleeding duodenal ulcers ranging between 5-9 mm in size were also found in the bulb and 2nd part of the duodenum with no blood collection. - Patient required Protonix drip infusion for 72 hours.  - throughout his hospital stay he has received total of 3 units of PRBC transfusion. - Continue daily iron supplementation. - Continue Protonix twice daily on discharge. - Follow-up with primary care physician in one week to recheck hemoglobin. - Tolerated diet well.  Active Problems: Leukocytosis - Likely reactive from acute GI bleed - No evidence of infection   Sinusitis - Order placed for low rate oxygen with humidifier which helped.   DVT Prophylaxis  - SCD's bilaterally   Code Status: Full.  Family Communication: plan of care discussed with the patient and his mother    IV access:  Peripheral IV  Procedures and diagnostic studies:   EGD 01/21/2015   Medical Consultants:  Gastroenterology  Other Consultants:  None  IAnti-Infectives:   None    Signed:  Leisa Lenz, MD  [facility] 01/24/2015, 8:49 AM  Pager #: [phone]  Time spent in minutes: more than 30 minutes   Discharge Exam: Filed Vitals:   01/24/15 0408  BP: 101/54  Pulse: 63  Temp: 98.2 F (36.8 C)  Resp: 18   Filed Vitals:   01/23/15 1534 01/23/15 1919 01/23/15 2335 01/24/15 0408  BP: 98/71 117/64 112/76 101/54  Pulse: 95 70 97 63  Temp:  98 F (36.7 C) 98.6 F (37 C) 98.2 F (36.8 C)  TempSrc:  Oral Oral Oral  Resp: 14 18 24 18   Height:      Weight:  SpO2: 100% 100% 99% 100%    General: Pt is alert, follows commands appropriately, not in acute distress; pale skin Cardiovascular: Regular rate and rhythm, S1/S2 +, no murmurs Respiratory: Clear to auscultation bilaterally, no wheezing, no crackles, no rhonchi Abdominal: Soft, non tender, non distended, bowel sounds +, no guarding Extremities: no edema, no cyanosis, pulses palpable bilaterally DP and  PT Neuro: Grossly nonfocal  Discharge Instructions  Discharge Instructions    Call MD for:  persistant nausea and vomiting    Complete by:  As directed      Call MD for:  severe uncontrolled pain    Complete by:  As directed      Diet - low sodium heart healthy    Complete by:  As directed      Increase activity slowly    Complete by:  As directed             Medication List    STOP taking these medications        aspirin-acetaminophen-caffeine 250-250-65 MG per tablet  Commonly known as:  EXCEDRIN MIGRAINE     famotidine 20 MG tablet  Commonly known as:  PEPCID     FLUoxetine 40 MG capsule  Commonly known as:  PROZAC      TAKE these medications        ferrous sulfate 325 (65 FE) MG tablet  Take 1 tablet (325 mg total) by mouth daily with breakfast.     Loratadine 10 MG Caps  Take 10 mg by mouth.     pantoprazole 40 MG tablet  Commonly known as:  PROTONIX  Take 1 tablet (40 mg total) by mouth 2 (two) times daily.           Follow-up Information    Follow up with BREEBACK, JADE, PA-C. Schedule an appointment as soon as possible for a visit in 1 week.   Specialty:  Family Medicine   Why:  Follow up appt after recent hospitalization   Contact information:   [address] [place] [address] [place] [address] [phone]        The results of significant diagnostics from this hospitalization (including imaging, microbiology, ancillary and laboratory) are listed below for reference.    Significant Diagnostic Studies: No results found.  Microbiology: Recent Results (from the past 240 hour(s))  MRSA PCR Screening     Status: None   Collection Time: 01/21/15  9:48 PM  Result Value Ref Range Status   MRSA by PCR NEGATIVE NEGATIVE Final    Comment:        The GeneXpert MRSA Assay (FDA approved for NASAL specimens only), is one component of a comprehensive MRSA colonization surveillance program. It is not intended to diagnose MRSA infection nor  to guide or monitor treatment for MRSA infections.      Labs: Basic Metabolic Panel:  Recent Labs Lab 01/21/15 1124 01/22/15 0238  NA 136 136  K 3.6 3.5  CL 103 104  CO2 23 23  GLUCOSE 106* 104*  BUN 25* 13  CREATININE 0.79 0.78  CALCIUM 8.4* 8.3*   Liver Function Tests:  Recent Labs Lab 01/21/15 1124  AST 18  ALT 21  ALKPHOS 55  BILITOT 0.4  PROT 5.4*  ALBUMIN 3.4*   No results for input(s): LIPASE, AMYLASE in the last 168 hours. No results for input(s): AMMONIA in the last 168 hours. CBC:  Recent Labs Lab 01/21/15 1124 01/21/15 2331 01/22/15 7026 01/23/15 3785 01/23/15 2039 01/24/15 0254  WBC 12.3* 12.2* 10.8* 7.1  --  8.7  NEUTROABS 10.1*  --   --   --   --   --   HGB 6.2* 7.3* 7.3* 6.8* 8.2* 7.6*  HCT 19.5* 21.8* 22.4* 20.9* 24.8* 22.9*  MCV 83.0 83.2 83.3 85.0  --  85.8  PLT 378 265 279 294  --  301   Cardiac Enzymes: No results for input(s): CKTOTAL, CKMB, CKMBINDEX, TROPONINI in the last 168 hours. BNP: BNP (last 3 results) No results for input(s): BNP in the last 8760 hours.  ProBNP (last 3 results) No results for input(s): PROBNP in the last 8760 hours.  CBG:  Recent Labs Lab 01/21/15 1234  GLUCAP 109*      '

## 2015-01-27 ENCOUNTER — Telehealth: Payer: Self-pay | Admitting: Internal Medicine

## 2015-01-27 NOTE — Telephone Encounter (Signed)
Left a message for patient to call back. 

## 2015-01-28 NOTE — Telephone Encounter (Signed)
Left a message for patient to call back. 

## 2015-01-30 NOTE — Telephone Encounter (Signed)
Left a message for patient to call back. 

## 2015-02-03 NOTE — Telephone Encounter (Signed)
Patient did not return calls

## 2015-02-18 ENCOUNTER — Ambulatory Visit (HOSPITAL_COMMUNITY): Payer: Self-pay | Admitting: Psychiatry

## 2015-02-21 ENCOUNTER — Ambulatory Visit (INDEPENDENT_AMBULATORY_CARE_PROVIDER_SITE_OTHER): Payer: 59 | Admitting: Physician Assistant

## 2015-02-21 ENCOUNTER — Encounter (HOSPITAL_COMMUNITY): Payer: Self-pay | Admitting: Physician Assistant

## 2015-02-21 VITALS — BP 126/78 | HR 67 | Ht 72.0 in | Wt 247.0 lb

## 2015-02-21 DIAGNOSIS — F411 Generalized anxiety disorder: Secondary | ICD-10-CM

## 2015-02-21 DIAGNOSIS — F4010 Social phobia, unspecified: Secondary | ICD-10-CM | POA: Diagnosis not present

## 2015-02-21 DIAGNOSIS — F329 Major depressive disorder, single episode, unspecified: Secondary | ICD-10-CM

## 2015-02-21 DIAGNOSIS — F331 Major depressive disorder, recurrent, moderate: Secondary | ICD-10-CM

## 2015-02-21 DIAGNOSIS — F429 Obsessive-compulsive disorder, unspecified: Secondary | ICD-10-CM

## 2015-02-21 MED ORDER — FLUOXETINE HCL 40 MG PO CAPS: 40.0000 mg | ORAL_CAPSULE | Freq: Every day | ORAL | Status: DC

## 2015-02-21 NOTE — Patient Instructions (Signed)
1. Continue all medication as ordered. 2. Call this office if you have any questions or concerns. 3. Continue to get regular exercise 3-5 times a week. 4. Continue to eat a healthy nutritionally balanced diet. 5. Continue to reduce stress and anxiety through activities such as yoga, mindfulness, meditation and or prayer. 6. Keep all appointments with your out patient therapist and have notes forwarded to this office. (If you do not have one and would like to be scheduled with a therapist, please let our office assist you with this. 7. Follow up as planned.  Evidence based medicine supports that taking these two supplements will improve your overall mental health.  *Vitamin D3 (5000 i.u.) take one per day. *B complex take one per day.

## 2015-02-21 NOTE — Progress Notes (Addendum)
[facility] MD Progress Note  02/21/2015 11:54 AM Leonard Lucas  MRN:  [id number] Subjective:  Leonard Lucas is in today to get a refill on his prozac. Quite a bit has happened for him in the interim. He was hospitalized emergently for a GI bleed due to duodenal ulcers.  He has been "all things considered ok." He is recovering well. He denies symptoms of depression including sadness, crying, isolation, or changes in his appetite.  He is exercising, he notes his sleep is fine, but never enough. Principal Problem: Social anxiety disorder, Depression Major Diagnosis:   Patient Active Problem List   Diagnosis Date Noted  . Erosive gastritis [K29.00]   . Leukocytosis [D72.829]   . Acute blood loss anemia [D62]   . GI bleed [K92.2] 01/21/2015  . Symptomatic anemia [D64.9] 01/21/2015  . History of migraine [Z86.69] 01/21/2015  . Duodenal ulcer hemorrhage [K26.4]   . Bleeding gastrointestinal [K92.2]   . Social anxiety disorder [F40.10] 07/11/2014  . Depression, major, recurrent, moderate [F33.1] 03/16/2012  . Generalized abdominal pain [R10.84] 02/02/2010   Total Time spent with patient: 30 minutes   Past Medical History:  Past Medical History  Diagnosis Date  . Anxiety   . Depression     Past Surgical History  Procedure Laterality Date  . Wisdom tooth extraction  08/2010  . Esophagogastroduodenoscopy N/A 01/21/2015    Procedure: ESOPHAGOGASTRODUODENOSCOPY (EGD);  Surgeon: Lafayette Dragon, MD;  Location: [facility] ENDOSCOPY;  Service: Endoscopy;  Laterality: N/A;   Family History:  Family History  Problem Relation Age of Onset  . Breast cancer Mother   . Depression Mother   . Depression Paternal Aunt    Social History:  History  Alcohol Use No     History  Drug Use No    History   Social History  . Marital Status: Single    Spouse Name: N/A  . Number of Children: N/A  . Years of Education: N/A   Social History Main Topics  . Smoking status: Never Smoker   . Smokeless tobacco: Not on file  .  Alcohol Use: No  . Drug Use: No  . Sexual Activity: No   Other Topics Concern  . None   Social History Narrative   Additional History: Plans to move to [facility] next month with a goal of going to [facility].  Sleep: Good  Appetite:  Good   Assessment: stable  Musculoskeletal: Strength & Muscle Tone: within normal limits Gait & Station: normal Patient leans: N/A   Psychiatric Specialty Exam: Physical Exam  ROS  Blood pressure 126/78, pulse 67, height 6' (1.829 m), weight 247 lb (112.038 kg), SpO2 93 %.Body mass index is 33.49 kg/(m^2).  General Appearance: Well Groomed  Engineer, water::  Good  Speech:  Clear and Coherent  Volume:  Normal  Mood:  Euthymic  Affect:  Congruent  Thought Process:  Coherent, Goal Directed, Intact, Linear and Logical  Orientation:  Full (Time, Place, and Person)  Thought Content:  WDL  Suicidal Thoughts:  No  Homicidal Thoughts:  No  Memory:  NA  Judgement:  Intact  Insight:  Good and Present  Psychomotor Activity:  Normal  Concentration:  Good  Recall:  Good  Fund of Knowledge:Good  Language: Good  Akathisia:  No  Handed:  Right  AIMS (if indicated):     Assets:  Communication Skills Desire for Improvement Financial Resources/Insurance Housing Leisure Time [place] Talents/Skills Transportation  ADL's:  Intact  Cognition:  WNL  Sleep:  Ok but not enough     Current Medications: Current Outpatient Prescriptions  Medication Sig Dispense Refill  . ferrous sulfate 325 (65 FE) MG tablet Take 1 tablet (325 mg total) by mouth daily with breakfast. 30 tablet 0  . FLUoxetine (PROZAC) 40 MG capsule Take 40 mg by mouth daily.    . Loratadine 10 MG CAPS Take 10 mg by mouth.    . pantoprazole (PROTONIX) 40 MG tablet Take 1 tablet (40 mg total) by mouth 2 (two) times daily. 60 tablet 0   No current facility-administered medications for this visit.    Lab Results: No results found for this  or any previous visit (from the past 48 hour(s)).  Physical Findings: AIMS:  CIWA:   COWS:    Treatment Plan Summary: Medication management   Medical Decision Making:  Established Problem, Stable/Improving (1) and Review of Psycho-Social Stressors (1)  MDD-stable  1. Medication refill x 1 month. 2. Recommended [employer] app for self soothing mechanisms. 3. Follow up with Dr. As planned.  Marlane Hatcher. Marda Breidenbach RPAC 12:04 PM 02/21/2015

## 2015-02-26 ENCOUNTER — Telehealth (HOSPITAL_COMMUNITY): Payer: Self-pay | Admitting: Psychiatry

## 2015-02-28 NOTE — Telephone Encounter (Signed)
Rx for Prozac was sent to pharmacy on 7/1.

## 2015-03-13 ENCOUNTER — Ambulatory Visit (INDEPENDENT_AMBULATORY_CARE_PROVIDER_SITE_OTHER): Payer: 59 | Admitting: Psychiatry

## 2015-03-13 ENCOUNTER — Encounter (HOSPITAL_COMMUNITY): Payer: Self-pay | Admitting: Psychiatry

## 2015-03-13 VITALS — BP 110/74 | HR 82 | Ht 72.0 in | Wt 247.0 lb

## 2015-03-13 DIAGNOSIS — F42 Obsessive-compulsive disorder: Secondary | ICD-10-CM | POA: Diagnosis not present

## 2015-03-13 DIAGNOSIS — F411 Generalized anxiety disorder: Secondary | ICD-10-CM

## 2015-03-13 DIAGNOSIS — F4010 Social phobia, unspecified: Secondary | ICD-10-CM

## 2015-03-13 DIAGNOSIS — F331 Major depressive disorder, recurrent, moderate: Secondary | ICD-10-CM

## 2015-03-13 MED ORDER — FLUOXETINE HCL 40 MG PO CAPS: 40.0000 mg | ORAL_CAPSULE | Freq: Every day | ORAL | Status: DC

## 2015-03-13 NOTE — Progress Notes (Signed)
Patient ID: Leonard Lucas, male   DOB: Jun 18, 1991, 24 y.o.   MRN: [id number] [place] Follow-up Outpatient Visit  JIBRAN CROOKSHANKS [id number] 24 y.o.  03/13/2015  Chief Complaint: depression follow up   History of Present Illness:    Mr. Cordner is a 24 y/o male with a past psychiatric history significant for symptoms of depression and anxiety. The patient is referred for psychiatric services for medication management.   Suicidality is doing better regarding anxiety less social anxiety. He believes medications helping now at this dose. He is trying to keep himself busy there is no reported side effects on Prozac He feels increasing the medication has helped and has no side effects. He is moving with family to [place] in august will transfer his services. Wants extra refills just in case. Not having severe social anxiety or hopelessness. Depression: 7/10 (0=Very depressed; 5=Neutral; 10=Very Happy)  Anxiety- 4/10 (0=no anxiety; 5= moderate/tolerable anxiety; 10= panic attacks)  . Duration;  Anxiety-since middle school. Recognized about 2 years ago  . Timing: Throughout the day, mostly days that he has work when he has it. Feels more balanced with meds  . Context: Obsessing about annoying a person, not being good at his work at times. But less critical of himself now.  . Modifying factors: Mood improves with not quitting.   Past Medical History  Diagnosis Date  . Anxiety   . Depression    Family History  Problem Relation Age of Onset  . Breast cancer Mother   . Depression Mother   . Depression Paternal Aunt     Outpatient Encounter Prescriptions as of 03/13/2015  Medication Sig  . ferrous sulfate 325 (65 FE) MG tablet Take 1 tablet (325 mg total) by mouth daily with breakfast.  . FLUoxetine (PROZAC) 40 MG capsule Take 1 capsule (40 mg total) by mouth daily.  . Loratadine 10 MG CAPS Take 10 mg by mouth.  . pantoprazole (PROTONIX) 40 MG tablet Take 1 tablet (40 mg total)  by mouth 2 (two) times daily.  . [DISCONTINUED] FLUoxetine (PROZAC) 40 MG capsule Take 1 capsule (40 mg total) by mouth daily.   No facility-administered encounter medications on file as of 03/13/2015.    Recent Results (from the past 2160 hour(s))  CBC w auto diff ([facility])     Status: None   Collection Time: 01/21/15  9:50 AM  Result Value Ref Range   WBC  4.5 - 10.5 K/uL    Comment: see scanned report. Hgb 5.9, repeated, 6.1. Provider notified   Lymphocytes relative %  15 - 45 %   Monocytes relative %  2 - 10 %   Neutrophils relative % (GR)  44 - 76 %   Lymphocytes absolute  0.1 - 1.8 K/uL   Monocyes absolute  0.1 - 1 K/uL   Neutrophils absolute (GR#)  1.7 - 7.7 K/uL   RBC  4.2 - 5.8 MIL/uL   Hemoglobin  13 - 17 g/dL   Hematocrit  38.5 - 51 %   MCV  80 - 98 fL   MCH  26.5 - 32.5 pg   MCHC  32.5 - 36.9 g/dL   RDW  11.6 - 14 %   Platelet count  140 - 400 K/uL   MPV  7.8 - 11 fL  POCT fasting CBG (manual entry)     Status: Abnormal   Collection Time: 01/21/15  9:53 AM  Result Value Ref Range   POCT Glucose ([facility]) 115 (  A) 70 - 99 mg/dL  CBC with Differential     Status: Abnormal   Collection Time: 01/21/15 11:24 AM  Result Value Ref Range   WBC 12.3 (H) 4.0 - 10.5 K/uL   RBC 2.35 (L) 4.22 - 5.81 MIL/uL   Hemoglobin 6.2 (LL) 13.0 - 17.0 g/dL    Comment: REPEATED TO VERIFY CRITICAL RESULT CALLED TO, READ BACK BY AND VERIFIED WITH: K COBB,RN AT 1239 01/21/15 BY K BARR    HCT 19.5 (L) 39.0 - 52.0 %   MCV 83.0 78.0 - 100.0 fL   MCH 26.4 26.0 - 34.0 pg   MCHC 31.8 30.0 - 36.0 g/dL   RDW 13.6 11.5 - 15.5 %   Platelets 378 150 - 400 K/uL   Neutrophils Relative % 82 (H) 43 - 77 %   Neutro Abs 10.1 (H) 1.7 - 7.7 K/uL   Lymphocytes Relative 13 12 - 46 %   Lymphs Abs 1.7 0.7 - 4.0 K/uL   Monocytes Relative 4 3 - 12 %   Monocytes Absolute 0.5 0.1 - 1.0 K/uL   Eosinophils Relative 0 0 - 5 %   Eosinophils Absolute 0.0 0.0 - 0.7 K/uL   Basophils Relative 0 0 - 1 %    Basophils Absolute 0.0 0.0 - 0.1 K/uL  Comprehensive metabolic panel     Status: Abnormal   Collection Time: 01/21/15 11:24 AM  Result Value Ref Range   Sodium 136 135 - 145 mmol/L   Potassium 3.6 3.5 - 5.1 mmol/L   Chloride 103 101 - 111 mmol/L   CO2 23 22 - 32 mmol/L   Glucose, Bld 106 (H) 65 - 99 mg/dL   BUN 25 (H) 6 - 20 mg/dL   Creatinine, Ser 0.79 0.61 - 1.24 mg/dL   Calcium 8.4 (L) 8.9 - 10.3 mg/dL   Total Protein 5.4 (L) 6.5 - 8.1 g/dL   Albumin 3.4 (L) 3.5 - 5.0 g/dL   AST 18 15 - 41 U/L   ALT 21 17 - 63 U/L   Alkaline Phosphatase 55 38 - 126 U/L   Total Bilirubin 0.4 0.3 - 1.2 mg/dL   GFR calc non Af Amer >60 >60 mL/min   GFR calc Af Amer >60 >60 mL/min    Comment: (NOTE) The eGFR has been calculated using the CKD EPI equation. This calculation has not been validated in all clinical situations. eGFR's persistently <60 mL/min signify possible Chronic Kidney Disease.    Anion gap 10 5 - 15  Type and screen     Status: None   Collection Time: 01/21/15 11:24 AM  Result Value Ref Range   ABO/RH(D) A POS    Antibody Screen NEG    Sample Expiration 01/24/2015    Unit Number [id number]    Blood Component Type RBC, LR IRR    Unit division 00    Status of Unit ISSUED,FINAL    Transfusion Status OK TO TRANSFUSE    Crossmatch Result Compatible    Unit Number [id number]    Blood Component Type RED CELLS,LR    Unit division 00    Status of Unit ISSUED,FINAL    Transfusion Status OK TO TRANSFUSE    Crossmatch Result Compatible    Unit Number [id number]    Blood Component Type RED CELLS,LR    Unit division 00    Status of Unit ISSUED,FINAL    Transfusion Status OK TO TRANSFUSE    Crossmatch Result Compatible   Prepare RBC  Status: None   Collection Time: 01/21/15 11:24 AM  Result Value Ref Range   Order Confirmation ORDER PROCESSED BY BLOOD BANK   ABO/Rh     Status: None   Collection Time: 01/21/15 11:24 AM  Result Value Ref Range   ABO/RH(D) A POS    CBG monitoring, ED     Status: Abnormal   Collection Time: 01/21/15 12:34 PM  Result Value Ref Range   Glucose-Capillary 109 (H) 65 - 99 mg/dL  POC occult blood, ED     Status: Abnormal   Collection Time: 01/21/15 12:39 PM  Result Value Ref Range   Fecal Occult Bld POSITIVE (A) NEGATIVE  Urinalysis, Routine w reflex microscopic     Status: None   Collection Time: 01/21/15 12:47 PM  Result Value Ref Range   Color, Urine YELLOW YELLOW   APPearance CLEAR CLEAR   Specific Gravity, Urine 1.023 1.005 - 1.030   pH 6.0 5.0 - 8.0   Glucose, UA NEGATIVE NEGATIVE mg/dL   Hgb urine dipstick NEGATIVE NEGATIVE   Bilirubin Urine NEGATIVE NEGATIVE   Ketones, ur NEGATIVE NEGATIVE mg/dL   Protein, ur NEGATIVE NEGATIVE mg/dL    Comment: NEGATIVE   Urobilinogen, UA 0.2 0.0 - 1.0 mg/dL   Nitrite NEGATIVE NEGATIVE   Leukocytes, UA NEGATIVE NEGATIVE    Comment: MICROSCOPIC NOT DONE ON URINES WITH NEGATIVE PROTEIN, BLOOD, LEUKOCYTES, NITRITE, OR GLUCOSE <1000 mg/dL.  Prepare RBC     Status: None   Collection Time: 01/21/15  2:14 PM  Result Value Ref Range   Order Confirmation ORDER PROCESSED BY BLOOD BANK   MRSA PCR Screening     Status: None   Collection Time: 01/21/15  9:48 PM  Result Value Ref Range   MRSA by PCR NEGATIVE NEGATIVE    Comment:        The GeneXpert MRSA Assay (FDA approved for NASAL specimens only), is one component of a comprehensive MRSA colonization surveillance program. It is not intended to diagnose MRSA infection nor to guide or monitor treatment for MRSA infections.   CBC     Status: Abnormal   Collection Time: 01/21/15 11:31 PM  Result Value Ref Range   WBC 12.2 (H) 4.0 - 10.5 K/uL   RBC 2.62 (L) 4.22 - 5.81 MIL/uL   Hemoglobin 7.3 (L) 13.0 - 17.0 g/dL   HCT 21.8 (L) 39.0 - 52.0 %   MCV 83.2 78.0 - 100.0 fL   MCH 27.9 26.0 - 34.0 pg   MCHC 33.5 30.0 - 36.0 g/dL   RDW 13.8 11.5 - 15.5 %   Platelets 265 150 - 400 K/uL  Basic metabolic panel     Status:  Abnormal   Collection Time: 01/22/15  2:38 AM  Result Value Ref Range   Sodium 136 135 - 145 mmol/L   Potassium 3.5 3.5 - 5.1 mmol/L   Chloride 104 101 - 111 mmol/L   CO2 23 22 - 32 mmol/L   Glucose, Bld 104 (H) 65 - 99 mg/dL   BUN 13 6 - 20 mg/dL   Creatinine, Ser 0.78 0.61 - 1.24 mg/dL   Calcium 8.3 (L) 8.9 - 10.3 mg/dL   GFR calc non Af Amer >60 >60 mL/min   GFR calc Af Amer >60 >60 mL/min    Comment: (NOTE) The eGFR has been calculated using the CKD EPI equation. This calculation has not been validated in all clinical situations. eGFR's persistently <60 mL/min signify possible Chronic Kidney Disease.    Anion gap 9  5 - 15  CBC     Status: Abnormal   Collection Time: 01/22/15  2:38 AM  Result Value Ref Range   WBC 10.8 (H) 4.0 - 10.5 K/uL   RBC 2.69 (L) 4.22 - 5.81 MIL/uL   Hemoglobin 7.3 (L) 13.0 - 17.0 g/dL   HCT 22.4 (L) 39.0 - 52.0 %   MCV 83.3 78.0 - 100.0 fL   MCH 27.1 26.0 - 34.0 pg   MCHC 32.6 30.0 - 36.0 g/dL   RDW 14.1 11.5 - 15.5 %   Platelets 279 150 - 400 K/uL  CBC     Status: Abnormal   Collection Time: 01/23/15  3:17 AM  Result Value Ref Range   WBC 7.1 4.0 - 10.5 K/uL   RBC 2.46 (L) 4.22 - 5.81 MIL/uL   Hemoglobin 6.8 (LL) 13.0 - 17.0 g/dL    Comment: REPEATED TO VERIFY CRITICAL RESULT CALLED TO, READ BACK BY AND VERIFIED WITH: M.CHRISTAIN RN 3524 01/23/15 E.GADDY    HCT 20.9 (L) 39.0 - 52.0 %   MCV 85.0 78.0 - 100.0 fL   MCH 27.6 26.0 - 34.0 pg   MCHC 32.5 30.0 - 36.0 g/dL   RDW 14.6 11.5 - 15.5 %   Platelets 294 150 - 400 K/uL  Prepare RBC     Status: None   Collection Time: 01/23/15  5:10 AM  Result Value Ref Range   Order Confirmation ORDER PROCESSED BY BLOOD BANK   Hemoglobin and hematocrit, blood     Status: Abnormal   Collection Time: 01/23/15  8:39 PM  Result Value Ref Range   Hemoglobin 8.2 (L) 13.0 - 17.0 g/dL   HCT 24.8 (L) 39.0 - 52.0 %  CBC     Status: Abnormal   Collection Time: 01/24/15  2:54 AM  Result Value Ref Range   WBC  8.7 4.0 - 10.5 K/uL   RBC 2.67 (L) 4.22 - 5.81 MIL/uL   Hemoglobin 7.6 (L) 13.0 - 17.0 g/dL   HCT 22.9 (L) 39.0 - 52.0 %   MCV 85.8 78.0 - 100.0 fL   MCH 28.5 26.0 - 34.0 pg   MCHC 33.2 30.0 - 36.0 g/dL   RDW 14.7 11.5 - 15.5 %   Platelets 301 150 - 400 K/uL    BP 110/74 mmHg  Pulse 82  Ht 6' (1.829 m)  Wt 247 lb (112.038 kg)  BMI 33.49 kg/m2   Review of Systems  Constitutional: Negative for fever.  Cardiovascular: Negative for chest pain and palpitations.  Skin: Negative for rash.  Psychiatric/Behavioral: Negative for depression and substance abuse.    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 Generalized anxiety disorder  Treatment Plan:   keep Prozac 40 mg. 3 refills given.  Patient moving to [place].    Pertinent Labs and Relevant Prior Notes reviewed. Medication Side effects, benefits and risks reviewed/discussed with Patient. Time given for patient to respond and asks questions regarding the Diagnosis and Medications. Safety concerns and to report to ER if suicidal or call 911. Relevant Medications refilled or called in to pharmacy. Discussed weight maintenance and Sleep Hygiene. Follow up with Primary care provider in regards  to Medical conditions. Recommend compliance with medications and follow up office appointments. Discussed to avail opportunity to consider or/and continue Individual therapy with Counselor. Greater than 50% of time was spend in counseling and coordination of care  with the patient.  Patient will be discharged from this clinic . Doing stable and moving to [place]. Extra refills given.  Call for concerns . He is planning to move in 2 weeks.   [name], MD 03/13/2015

## 2015-03-18 ENCOUNTER — Ambulatory Visit: Payer: Self-pay | Admitting: Internal Medicine

## 2015-03-24 NOTE — Telephone Encounter (Signed)
Please fax letter to [phone] attn:Michelle Milford Cage

## 2015-03-24 NOTE — Telephone Encounter (Signed)
PT needs a letter stating he can have his pet rats in his apartment and that they help with his anxiety.

## 2015-03-25 NOTE — Telephone Encounter (Signed)
Letter faxed to [facility] on 8/2.

## 2015-09-08 ENCOUNTER — Encounter (HOSPITAL_COMMUNITY): Payer: Self-pay | Admitting: Psychiatry

## 2015-09-08 ENCOUNTER — Ambulatory Visit (INDEPENDENT_AMBULATORY_CARE_PROVIDER_SITE_OTHER): Payer: 59 | Admitting: Psychiatry

## 2015-09-08 DIAGNOSIS — F4010 Social phobia, unspecified: Secondary | ICD-10-CM | POA: Diagnosis not present

## 2015-09-08 DIAGNOSIS — F331 Major depressive disorder, recurrent, moderate: Secondary | ICD-10-CM

## 2015-09-08 DIAGNOSIS — F411 Generalized anxiety disorder: Secondary | ICD-10-CM | POA: Diagnosis not present

## 2015-09-08 MED ORDER — FLUOXETINE HCL 40 MG PO CAPS: 40.0000 mg | ORAL_CAPSULE | Freq: Every day | ORAL | Status: DC

## 2015-09-08 MED ORDER — BUPROPION HCL 100 MG PO TABS: 100.0000 mg | ORAL_TABLET | Freq: Two times a day (BID) | ORAL | Status: DC

## 2015-09-08 MED FILL — buPROPion HCL 100 MG TABS: 100 | 30 days supply | Qty: 60 | Fill #0

## 2015-09-08 MED FILL — FLUoxetine HCL 40 MG CAPS: 40 | 30 days supply | Qty: 30 | Fill #0

## 2015-09-08 NOTE — Progress Notes (Signed)
Patient ID: Leonard Lucas, male   DOB: 08-08-1991, 25 y.o.   MRN: [id number] [place] Follow-up Outpatient Visit  BILBO GIESKE [id number] 25 y.o.  09/08/2015  Chief Complaint: depression follow up   History of Present Illness:    Leonard Lucas is a 25 y/o male with a past psychiatric history significant for symptoms of depression and anxiety. The patient was referred for psychiatric services for medication management.   Last seen in July 2016. He is coming back after 7 months. He moved to [place]. It did not go too well. Leonard Lucas is back in [place] and the parents are together so he is now living with them. He feels dysphoric says that things does not make him happy. He had a difficult New Year's Eve. Says that he feels sometimes things are not going to get better and that making more down. No particular etiology or aggravating factor of depression as of now Social anxiety: baseline. Has few friends  Depression: 4/10 (0=Very depressed; 5=Neutral; 10=Very Happy)  Anxiety- 4/10 (0=no anxiety; 5= moderate/tolerable anxiety; 10= panic attacks)  . Duration;  Anxiety-since middle school. Recognized about 3 years ago  . Timing: Throughout the day, more so in the morning. . Context: Obsessing of how good is he or not . Modifying factors: Mood improves with not quitting.   Past Medical History  Diagnosis Date  . Anxiety   . Depression    Family History  Problem Relation Age of Onset  . Breast cancer Mother   . Depression Mother   . Depression Paternal Aunt     Outpatient Encounter Prescriptions as of 09/08/2015  Medication Sig  . buPROPion (WELLBUTRIN) 100 MG tablet Take 1 tablet (100 mg total) by mouth 2 (two) times daily. Start one a day and increase to twice a day in 5 days.  . ferrous sulfate 325 (65 FE) MG tablet Take 1 tablet (325 mg total) by mouth daily with breakfast.  . FLUoxetine (PROZAC) 40 MG capsule Take 1 capsule (40 mg total) by mouth daily.  . Loratadine  10 MG CAPS Take 10 mg by mouth.  . pantoprazole (PROTONIX) 40 MG tablet Take 1 tablet (40 mg total) by mouth 2 (two) times daily.  . [DISCONTINUED] FLUoxetine (PROZAC) 40 MG capsule Take 1 capsule (40 mg total) by mouth daily.   No facility-administered encounter medications on file as of 09/08/2015.    No results found for this or any previous visit (from the past 2160 hour(s)).  There were no vitals taken for this visit.   Review of Systems  Constitutional: Negative for fever.  Cardiovascular: Negative for chest pain and palpitations.  Skin: Negative for rash.  Psychiatric/Behavioral: Positive for depression. Negative for substance abuse.    Mental Status Examination  Appearance: casual' Alert: Yes Attention: fair  Cooperative: Yes Eye Contact: Fair Speech: normal tone Psychomotor Activity: Decreased Memory/Concentration: adequate  Oriented: person, place and time/date Mood: depressed Affect: Congruent Thought Processes and Associations: Coherent Fund of Knowledge: Fair Thought Content: Suicidal ideation and Homicidal ideation were denied Insight: Fair Judgement: Fair  Diagnosis: Maj. depressive disorder recurrent moderate. Obsessive-compulsive traits. Generalized anxiety disorder  Treatment Plan:  MDD: Prozac 40 mg. Add wellbutrin 100mg  and increase to 200mg  in 5 days for depression OCD: prozac as above GAD: Prozac as above Recommend therapy to deal with depression and possible low self esteem. Need to come regularly as he came after 7 months this time. He was discharged from the clinic as he moved  to [facility] but now wants to get into service. Not interested for inpatient treatment says that he is not suicidal he did work with outpatient treatment  Pertinent Labs and Relevant Prior Notes reviewed. Medication Side effects, benefits and risks reviewed/discussed with Patient. Time given for patient to respond and asks questions regarding the Diagnosis and  Medications. Safety concerns and to report to ER if suicidal or call 911. Relevant Medications refilled or called in to pharmacy. Discussed weight maintenance and Sleep Hygiene. Follow up with Primary care provider in regards to Medical conditions. More than 50% time spent in counseling and coordination of care including patient education Time spent 25 minutes Follow up in 3 weeks.   [name], MD 09/08/2015

## 2015-10-02 ENCOUNTER — Ambulatory Visit (HOSPITAL_COMMUNITY): Payer: 59 | Admitting: Psychiatry

## 2015-10-08 ENCOUNTER — Telehealth (HOSPITAL_COMMUNITY): Payer: Self-pay | Admitting: *Deleted

## 2015-10-08 NOTE — Telephone Encounter (Signed)
Received medication request from [facility] for Fluoxetine HCL 40mg . Per Dr. De Nurse, medication request is denied. Pt will need to schedule an appt. LVM for pt to return call to office.

## 2015-10-24 ENCOUNTER — Ambulatory Visit (INDEPENDENT_AMBULATORY_CARE_PROVIDER_SITE_OTHER): Payer: 59 | Admitting: Psychiatry

## 2015-10-24 ENCOUNTER — Encounter (HOSPITAL_COMMUNITY): Payer: Self-pay | Admitting: Psychiatry

## 2015-10-24 VITALS — BP 124/62 | HR 83 | Ht 72.0 in | Wt 293.0 lb

## 2015-10-24 DIAGNOSIS — F411 Generalized anxiety disorder: Secondary | ICD-10-CM

## 2015-10-24 DIAGNOSIS — F429 Obsessive-compulsive disorder, unspecified: Secondary | ICD-10-CM | POA: Diagnosis not present

## 2015-10-24 DIAGNOSIS — F4010 Social phobia, unspecified: Secondary | ICD-10-CM | POA: Diagnosis not present

## 2015-10-24 DIAGNOSIS — F331 Major depressive disorder, recurrent, moderate: Secondary | ICD-10-CM | POA: Diagnosis not present

## 2015-10-24 MED ORDER — FLUOXETINE HCL 20 MG PO TABS: 60.0000 mg | ORAL_TABLET | Freq: Every day | ORAL | Status: DC

## 2015-10-24 MED ORDER — BUPROPION HCL 100 MG PO TABS: 100.0000 mg | ORAL_TABLET | Freq: Every morning | ORAL | Status: DC

## 2015-10-24 MED FILL — FLUoxetine HCL 20 MG CAPS: 20 | 30 days supply | Qty: 90 | Fill #0

## 2015-10-24 MED FILL — buPROPion HCL 100 MG TABS: 100 | 30 days supply | Qty: 60 | Fill #0

## 2015-10-24 NOTE — Progress Notes (Signed)
Patient ID: Leonard Lucas, male   DOB: 11/16/1990, 25 y.o.   MRN: [id number] [name] Follow-up Outpatient Visit  Leonard Lucas [id number] 25 y.o.  10/24/2015  Chief Complaint: depression follow up   History of Present Illness:    Leonard Lucas is a 25 y/o male with a past psychiatric history significant for symptoms of depression and anxiety. The patient returns for psychiatric services for medication management.   Glasses restarted back on Prozac and also added Wellbutrin. He still feels low self-esteem. He is living with his parents they want him and his brother to move out. States that there is a lot to worry about. He does work part-time. Does not feel his depression is improving. Says that if he does move all difficulty financially difficult but it may help as the first step Has not started therapy yet Social anxiety: baseline. Has few friends  Depression: 4/10 (0=Very depressed; 5=Neutral; 10=Very Happy)  Anxiety- 4/10 (0=no anxiety; 5= moderate/tolerable anxiety; 10= panic attacks)  . Duration;  Anxiety-since middle school. Recognized about 4 years ago  . Timing: Throughout the day, more so in the morning. . Context: Obsessing of how good is he or not . Modifying factors: Mood improves with not quitting.   Past Medical History  Diagnosis Date  . Anxiety   . Depression    Family History  Problem Relation Age of Onset  . Breast cancer Mother   . Depression Mother   . Depression Paternal Aunt     Outpatient Encounter Prescriptions as of 10/24/2015  Medication Sig  . buPROPion (WELLBUTRIN) 100 MG tablet Take 1 tablet (100 mg total) by mouth every morning. Take 2 a day.  . ferrous sulfate 325 (65 FE) MG tablet Take 1 tablet (325 mg total) by mouth daily with breakfast.  . Loratadine 10 MG CAPS Take 10 mg by mouth.  . [DISCONTINUED] buPROPion (WELLBUTRIN) 100 MG tablet Take 1 tablet (100 mg total) by mouth 2 (two) times daily. Start one a day and increase to  twice a day in 5 days.  . [DISCONTINUED] FLUoxetine (PROZAC) 40 MG capsule Take 1 capsule (40 mg total) by mouth daily.  Marland Kitchen FLUoxetine (PROZAC) 20 MG tablet Take 3 tablets (60 mg total) by mouth daily.  . [DISCONTINUED] pantoprazole (PROTONIX) 40 MG tablet Take 1 tablet (40 mg total) by mouth 2 (two) times daily. (Patient not taking: Reported on 10/24/2015)   No facility-administered encounter medications on file as of 10/24/2015.    No results found for this or any previous visit (from the past 2160 hour(s)).  BP 124/62 mmHg  Pulse 83  Ht 6' (1.829 m)  Wt 293 lb (132.904 kg)  BMI 39.73 kg/m2  SpO2 96%   Review of Systems  Constitutional: Negative for fever.  Cardiovascular: Negative for chest pain and palpitations.  Skin: Negative for rash.  Psychiatric/Behavioral: Positive for depression. Negative for suicidal ideas and substance abuse.    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 Obsessive-compulsive traits. Generalized anxiety disorder  Treatment Plan:  MDD:  Increase Prozac 60 mg. Continue wellbutrin  200mg  for depression OCD: prozac as above GAD: Prozac as above Recommend  therapy to deal with depression and possible low self esteem. Need to come regularly  More than 50% time spent in counseling and coordination of care including patient education. Supportive therapy and coping skills reviewed. Pertinent Labs and Relevant Prior Notes reviewed.  Time given for patient to respond and asks questions regarding  the Diagnosis and Medications. Safety concerns and to report to ER if suicidal or call 911. Relevant Medications refilled or called in to pharmacy. Discussed weight maintenance and Sleep Hygiene. Follow up with Primary care provider in regards to Medical conditions.  Time spent 25 minutes Follow up in 3 weeks.   [name], MD 10/24/2015

## 2015-11-25 ENCOUNTER — Encounter (HOSPITAL_COMMUNITY): Payer: Self-pay | Admitting: Psychiatry

## 2015-11-25 ENCOUNTER — Ambulatory Visit (INDEPENDENT_AMBULATORY_CARE_PROVIDER_SITE_OTHER): Payer: 59 | Admitting: Psychiatry

## 2015-11-25 VITALS — BP 124/68 | HR 85 | Ht 72.0 in | Wt 294.0 lb

## 2015-11-25 DIAGNOSIS — F4010 Social phobia, unspecified: Secondary | ICD-10-CM

## 2015-11-25 DIAGNOSIS — F331 Major depressive disorder, recurrent, moderate: Secondary | ICD-10-CM

## 2015-11-25 DIAGNOSIS — F411 Generalized anxiety disorder: Secondary | ICD-10-CM | POA: Diagnosis not present

## 2015-11-25 MED ORDER — FLUOXETINE HCL 20 MG PO TABS: 60.0000 mg | ORAL_TABLET | Freq: Every day | ORAL | Status: DC

## 2015-11-25 MED ORDER — BUPROPION HCL 100 MG PO TABS: 300.0000 mg | ORAL_TABLET | Freq: Every morning | ORAL | Status: DC

## 2015-11-25 MED FILL — buPROPion HCL 100 MG TABS: 100 | 30 days supply | Qty: 90 | Fill #0

## 2015-11-25 NOTE — Progress Notes (Signed)
Patient ID: Leonard Lucas, male   DOB: 05/09/1991, 25 y.o.   MRN: [id number] [place] Follow-up Outpatient Visit  Leonard Lucas [id number] 24 y.o.  11/25/2015  Chief Complaint: depression follow up   History of Present Illness:    Leonard Lucas is a 25 y/o male with a past psychiatric history significant for symptoms of depression and anxiety. The patient returns for psychiatric services for medication management.   Only some improvement but overall he is now looking for therapy has found one therapist he has scheduled. Prozac was increased last visit. Does not report any side effects but he understands without medication he may more depressed. Planning to move with his brother that may help the social situation because his parents as not wanting to be home. Has not started therapy yet but will soon. Social anxiety: baseline. Has few friends  Depression: 4/10 (0=Very depressed; 5=Neutral; 10=Very Happy)  Anxiety- 4/10 (0=no anxiety; 5= moderate/tolerable anxiety; 10= panic attacks)  . Duration;  Anxiety-since middle school. Recognized about 5  years ago  . Timing: Throughout the day, more so in the morning. . Context: Obsessing of how good is he or not . Modifying factors: Mood improves with not quitting.   Past Medical History  Diagnosis Date  . Anxiety   . Depression    Family History  Problem Relation Age of Onset  . Breast cancer Mother   . Depression Mother   . Depression Paternal Aunt     Outpatient Encounter Prescriptions as of 11/25/2015  Medication Sig  . buPROPion (WELLBUTRIN) 100 MG tablet Take 3 tablets (300 mg total) by mouth every morning. Take 3 a day  . FLUoxetine (PROZAC) 20 MG tablet Take 3 tablets (60 mg total) by mouth daily.  . Loratadine 10 MG CAPS Take 10 mg by mouth.  . [DISCONTINUED] buPROPion (WELLBUTRIN) 100 MG tablet Take 1 tablet (100 mg total) by mouth every morning. Take 2 a day.  . [DISCONTINUED] FLUoxetine (PROZAC) 20 MG tablet  Take 3 tablets (60 mg total) by mouth daily.  . ferrous sulfate 325 (65 FE) MG tablet Take 1 tablet (325 mg total) by mouth daily with breakfast. (Patient not taking: Reported on 11/25/2015)   No facility-administered encounter medications on file as of 11/25/2015.    No results found for this or any previous visit (from the past 2160 hour(s)).  BP 124/68 mmHg  Pulse 85  Ht 6' (1.829 m)  Wt 294 lb (133.358 kg)  BMI 39.86 kg/m2  SpO2 97%   Review of Systems  Constitutional: Negative for fever.  Cardiovascular: Negative for chest pain and palpitations.  Skin: Negative for rash.  Psychiatric/Behavioral: Positive for depression. Negative for suicidal ideas and substance abuse. The patient is nervous/anxious.     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 Obsessive-compulsive traits. Generalized anxiety disorder  Treatment Plan:  MDD:  Continue  Prozac 60 mg.  Increase wellbutrin  3 00mg  for depression OCD: prozac as above GAD: Prozac as above Recommend therapy to deal with depression and possible low self esteem. He has found one  Need to come regularly  More than 50% time spent in counseling  and coordination of care including patient education. Supportive therapy and coping skills reviewed.  Discussed weight maintenance and Sleep Hygiene. Follow up with Primary care provider in regards to Medical conditions.  Time spent 25 minutes Follow up in 3 weeks.   [name], MD 11/25/2015

## 2015-12-12 DIAGNOSIS — F39 Unspecified mood [affective] disorder: Secondary | ICD-10-CM | POA: Diagnosis not present

## 2015-12-17 ENCOUNTER — Encounter (HOSPITAL_COMMUNITY): Payer: Self-pay | Admitting: Psychiatry

## 2015-12-17 ENCOUNTER — Ambulatory Visit (INDEPENDENT_AMBULATORY_CARE_PROVIDER_SITE_OTHER): Payer: 59 | Admitting: Psychiatry

## 2015-12-17 VITALS — BP 126/64 | HR 84 | Ht 72.0 in | Wt 294.0 lb

## 2015-12-17 DIAGNOSIS — F411 Generalized anxiety disorder: Secondary | ICD-10-CM

## 2015-12-17 DIAGNOSIS — F331 Major depressive disorder, recurrent, moderate: Secondary | ICD-10-CM

## 2015-12-17 DIAGNOSIS — F4010 Social phobia, unspecified: Secondary | ICD-10-CM | POA: Diagnosis not present

## 2015-12-17 MED ORDER — BUPROPION HCL 100 MG PO TABS: 300.0000 mg | ORAL_TABLET | Freq: Every morning | ORAL | Status: DC

## 2015-12-17 MED FILL — buPROPion HCL 100 MG TABS: 100 | 30 days supply | Qty: 90 | Fill #0

## 2015-12-17 NOTE — Progress Notes (Signed)
Patient ID: Leonard Lucas, male   DOB: 03-Dec-1990, 25 y.o.   MRN: [id number] Leonard Lucas Follow-up Outpatient Visit  Leonard Lucas [id number] 24 y.o.  12/17/2015  Chief Complaint: depression follow up   History of Present Illness:    Leonard Lucas is a 25 y/o male with a past psychiatric history significant for symptoms of depression and anxiety. The patient returns for psychiatric services and for medication management.   Last visit increase Wellbutrin to 300 mg and continue Prozac apparently for some reason he did not feel that the Prozac says that he can increase the Wellbutrin and has helped. He has started therapy and only one session but it had helped him to build him some confidence and not to let other people judge him. Depression is somewhat improved. He is trying to relocate with his brother in an apartment so keep away from his family or parents.  Social anxiety: baseline. Has few friends  Depression: 6/10 (0=Very depressed; 5=Neutral; 10=Very Happy) (improved) Anxiety- 3/10 (0=no anxiety; 5= moderate/tolerable anxiety; 10= panic attacks)  . Duration;  Anxiety-since middle school. Recognized around 2010 . Timing: depends upon stress level . Context: Obsessing of how good is he or not . Modifying factors: Mood improves with not quitting. Therapy helped   Past Medical History  Diagnosis Date  . Anxiety   . Depression    Family History  Problem Relation Age of Onset  . Breast cancer Mother   . Depression Mother   . Depression Paternal Aunt     Outpatient Encounter Prescriptions as of 12/17/2015  Medication Sig  . buPROPion (WELLBUTRIN) 100 MG tablet Take 3 tablets (300 mg total) by mouth every morning. Take 3 a day  . Loratadine 10 MG CAPS Take 10 mg by mouth.  . [DISCONTINUED] buPROPion (WELLBUTRIN) 100 MG tablet Take 3 tablets (300 mg total) by mouth every morning. Take 3 a day  . [DISCONTINUED] FLUoxetine (PROZAC) 20 MG tablet Take 3 tablets (60 mg  total) by mouth daily.  . ferrous sulfate 325 (65 FE) MG tablet Take 1 tablet (325 mg total) by mouth daily with breakfast. (Patient not taking: Reported on 11/25/2015)   No facility-administered encounter medications on file as of 12/17/2015.    No results found for this or any previous visit (from the past 2160 hour(s)).  BP 126/64 mmHg  Pulse 84  Ht 6' (1.829 m)  Wt 294 lb (133.358 kg)  BMI 39.86 kg/m2  SpO2 96%   Review of Systems  Constitutional: Negative for fever.  Cardiovascular: Negative for chest pain and palpitations.  Skin: Negative for rash.  Neurological: Negative for tremors.  Psychiatric/Behavioral: Negative for suicidal ideas and substance abuse.    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 Generalized anxiety disorder  Treatment Plan:  MDD:  Continue wellbutrin 300mg . Has not been taking prozac and does not want to start back since he feels better on wellbutrin only. OCD: not worsened despite not being on prozac. GAD: baseline. Says will consider prozac or other med if needed. Just want to keep wellbutrin for now Recommend continue therapy to deal with depression and possible low self  esteem. He has found one and last session helped. Cannot do frequent sessions due to cost concerns.  Need to come regularly  More than 50% time spent in counseling and coordination of care including patient education. Supportive therapy and coping skills reviewed.  Discussed weight  maintenance and Sleep Hygiene. Follow up with Primary care provider in regards to Medical conditions.  Time spent 25 minutes Follow up in 6 to 8 weeks.  [name], MD 12/17/2015

## 2016-01-23 MED FILL — buPROPion HCL 100 MG TABS: 100 | 30 days supply | Qty: 90 | Fill #1

## 2016-02-09 ENCOUNTER — Ambulatory Visit (HOSPITAL_COMMUNITY): Payer: Self-pay | Admitting: Psychiatry

## 2016-02-26 ENCOUNTER — Emergency Department
Admission: EM | Admit: 2016-02-26 | Discharge: 2016-02-26 | Disposition: A | Discharge status: Home / Self Care | Payer: 59 | Source: Home / Self Care | Attending: Emergency Medicine | Admitting: Emergency Medicine

## 2016-02-26 ENCOUNTER — Encounter: Payer: Self-pay | Admitting: Emergency Medicine

## 2016-02-26 DIAGNOSIS — K2970 Gastritis, unspecified, without bleeding: Secondary | ICD-10-CM | POA: Diagnosis not present

## 2016-02-26 DIAGNOSIS — K2990 Gastroduodenitis, unspecified, without bleeding: Secondary | ICD-10-CM | POA: Diagnosis not present

## 2016-02-26 DIAGNOSIS — K279 Peptic ulcer, site unspecified, unspecified as acute or chronic, without hemorrhage or perforation: Secondary | ICD-10-CM

## 2016-02-26 MED ORDER — ESOMEPRAZOLE MAGNESIUM 40 MG PO CPDR: 40.0000 mg | DELAYED_RELEASE_CAPSULE | Freq: Every day | ORAL | Status: DC

## 2016-02-26 MED FILL — ESOMEPRAZOLE MAG DR 40 MG C: 40 | 15 days supply | Qty: 15 | Fill #0

## 2016-02-26 NOTE — ED Notes (Signed)
Peptic Ulcer pain started today, dull constant pain, no appetite, 3/10

## 2016-02-26 NOTE — ED Provider Notes (Signed)
CSN: [id number]     Arrival date & time 02/26/16  1401 History   First MD Initiated Contact with Patient 02/26/16 1419     Chief Complaint  Patient presents with  . Peptic Ulcer Disease   Patient presents to [facility] HPI Complains of mild epigastric periumbilical dull pain today without definite nausea or vomiting . Has mild decreased appetite. No associated fever or chills or any urinary symptoms. History of "stomach ulcers" by EGD 12/2014. He states he took a PPI for several months last year and does symptoms resolved until today. He requests getting back on a PPI. He denies using NSAIDs. No melena or bright red blood per rectum.  Past Medical History  Diagnosis Date  . Anxiety   . Depression    Past Surgical History  Procedure Laterality Date  . Wisdom tooth extraction  08/2010  . Esophagogastroduodenoscopy N/A 01/21/2015    Procedure: ESOPHAGOGASTRODUODENOSCOPY (EGD);  Surgeon: Lafayette Dragon, MD;  Location: [facility] ENDOSCOPY;  Service: Endoscopy;  Laterality: N/A;   Family History  Problem Relation Age of Onset  . Breast cancer Mother   . Depression Mother   . Depression Paternal Aunt    Social History  Substance Use Topics  . Smoking status: Never Smoker   . Smokeless tobacco: None  . Alcohol Use: No    Review of Systems  All other systems reviewed and are negative.   Allergies  Review of patient's allergies indicates no known allergies.  Home Medications   Prior to Admission medications   Medication Sig Start Date End Date Taking? Authorizing Provider  buPROPion (WELLBUTRIN) 100 MG tablet Take 3 tablets (300 mg total) by mouth every morning. Take 3 a day 12/17/15   [name], MD  esomeprazole (NEXIUM) 40 MG capsule Take 1 capsule (40 mg total) by mouth daily. 02/26/16 03/27/16  Jacqulyn Cane, MD  Loratadine 10 MG CAPS Take 10 mg by mouth.    Historical Provider, MD   Meds Ordered and Administered this Visit  Medications - No data to display  BP 120/84  mmHg  Pulse 83  Temp(Src) 98.6 F (37 C) (Oral)  Ht 6' (1.829 m)  Wt 291 lb (131.997 kg)  BMI 39.46 kg/m2  SpO2 96% No data found.   Physical Exam  Constitutional: He is oriented to person, place, and time. He appears well-developed and well-nourished. No distress.  HENT:  Head: Normocephalic and atraumatic.  Eyes: Conjunctivae and EOM are normal. Pupils are equal, round, and reactive to light. No scleral icterus.  Neck: Normal range of motion.  Cardiovascular: Normal rate.   Pulmonary/Chest: Effort normal.  Abdominal: Soft. Bowel sounds are normal. He exhibits no distension. There is tenderness (Minimal, periumbilical and epigastric, without masses guarding or rebound).  Musculoskeletal: Normal range of motion.  Neurological: He is alert and oriented to person, place, and time.  Skin: Skin is warm. No rash noted.  Psychiatric: He has a normal mood and affect.  Nursing note and vitals reviewed.   ED Course  Procedures (including critical care time)  Labs Review Labs Reviewed - No data to display  Imaging Review No results found.    MDM   1. Gastritis and gastroduodenitis   2. Peptic ulcer disease   Personal history of peptic ulcer disease, but no signs or symptoms to suggest acute bleeding. No melena or bright red blood per rectum. He declined any blood work or other testing at this time, but he requests restarting a PPI. Treatment options discussed,  as well as risks, benefits, alternatives. Patient voiced understanding and agreement with the following plans: Discharge Medication List as of 02/26/2016  3:01 PM    START taking these medications   Details  esomeprazole (NEXIUM) 40 MG capsule Take 1 capsule (40 mg total) by mouth daily., Starting 02/26/2016, Until Sat 03/27/16, Normal      #15, one refill prescribed.  Follow-up with your primary care doctor or gastroenterologist in 5-7 days if not improving, or sooner if symptoms become worse. Precautions  discussed. Red flags discussed. Questions invited and answered. Patient voiced understanding and agreement.     Jacqulyn Cane, MD 02/27/16 [phone]

## 2016-02-27 ENCOUNTER — Telehealth (HOSPITAL_COMMUNITY): Payer: Self-pay | Admitting: *Deleted

## 2016-02-27 NOTE — Telephone Encounter (Signed)
Received fax from [facility] for Bupropion HCL 100mg . Per Dr. De Nurse, refill request is denied. Pt will need to schedule an appt with clinic. LVM for pt to contact office.

## 2016-03-11 ENCOUNTER — Telehealth (HOSPITAL_COMMUNITY): Payer: Self-pay | Admitting: *Deleted

## 2016-03-11 MED ORDER — BUPROPION HCL 100 MG PO TABS: 300.0000 mg | ORAL_TABLET | Freq: Every morning | ORAL | Status: DC

## 2016-03-11 MED FILL — buPROPion HCL 100 MG TABS: 100 | 21 days supply | Qty: 63 | Fill #0

## 2016-03-11 MED FILL — ESOMEPRAZOLE MAG DR 40 MG C: 40 | 15 days supply | Qty: 15 | Fill #1

## 2016-03-11 NOTE — Telephone Encounter (Signed)
Pt phone into clinic requesting a refill for Bupropion. Per Dr. De Nurse, refill for Bupropion 100mg , #63. Refill was escribed to pharmacy. Please inform pt the refill sent to pharmacy is enough until appt on 04/02/16. No more refills will be issue if pt miss appt. Pt is aware of provider's response. Pt shows understanding.

## 2016-03-15 ENCOUNTER — Other Ambulatory Visit (HOSPITAL_COMMUNITY): Payer: Self-pay | Admitting: Psychiatry

## 2016-03-15 NOTE — Telephone Encounter (Signed)
Fax received from [facility] requesting a refill for Bupropion. Per Dr. De Nurse, refill request  for Bupropion 100mg , #63 was authorized on 03/11/16. Refill was escribed to pharmacy. Please inform pt the refill sent to pharmacy is enough until appt on 04/02/16. No more refills will be issue if pt miss appt. Pt is aware of provider's response. Pt shows understanding.

## 2016-04-02 ENCOUNTER — Encounter (HOSPITAL_COMMUNITY): Payer: Self-pay | Admitting: Psychiatry

## 2016-04-02 ENCOUNTER — Ambulatory Visit (INDEPENDENT_AMBULATORY_CARE_PROVIDER_SITE_OTHER): Payer: 59 | Admitting: Psychiatry

## 2016-04-02 DIAGNOSIS — F411 Generalized anxiety disorder: Secondary | ICD-10-CM | POA: Diagnosis not present

## 2016-04-02 DIAGNOSIS — F331 Major depressive disorder, recurrent, moderate: Secondary | ICD-10-CM

## 2016-04-02 DIAGNOSIS — F4010 Social phobia, unspecified: Secondary | ICD-10-CM | POA: Diagnosis not present

## 2016-04-02 DIAGNOSIS — F429 Obsessive-compulsive disorder, unspecified: Secondary | ICD-10-CM | POA: Diagnosis not present

## 2016-04-02 MED ORDER — BUPROPION HCL 100 MG PO TABS: 400.0000 mg | ORAL_TABLET | Freq: Every morning | ORAL | 1 refills | Status: DC

## 2016-04-02 MED FILL — buPROPion HCL 100 MG TABS: 100 | 30 days supply | Qty: 120 | Fill #0

## 2016-04-02 NOTE — Progress Notes (Signed)
Patient ID: Leonard Lucas, male   DOB: Aug 01, 1991, 25 y.o.   MRN: [id number] [place] Follow-up Outpatient Visit  Leonard Lucas [id number] 25 y.o.  04/02/2016  Chief Complaint: depression follow up   History of Present Illness:    Leonard Lucas is a 25 y/o male with a past psychiatric history significant for symptoms of depression and anxiety. The patient returns for psychiatric services and for medication management.   Patient living with his brother but he does not offer much help he felt Wellbutrin at a dose of 3 mg at helped in the beginning but now it has plateaued. He is not on Prozac and does not have the Prozac was much helpful he has worked through his anxiety and is not having that much of social anxiety or excessive worries but he does feel that his mood does get somewhat down he keeps himself busy by work but overall does not have much of other activities  Social anxiety: baseline. Has few friends  Depression: 6/10 (0=Very depressed; 5=Neutral; 10=Very Happy) Anxiety- 3/10 (0=no anxiety; 5= moderate/tolerable anxiety; 10= panic attacks)  . Duration;  Anxiety-since middle school. Recognized around 2010 . Timing: depends upon stress level . Context: Obsessing of how good is he or not . Modifying factors: Mood improves with not quitting. Therapy helped in past but now cannot afford it   Past Medical History:  Diagnosis Date  . Anxiety   . Depression    Family History  Problem Relation Age of Onset  . Breast cancer Mother   . Depression Mother   . Depression Paternal Aunt     Outpatient Encounter Prescriptions as of 04/02/2016  Medication Sig  . buPROPion (WELLBUTRIN) 100 MG tablet Take 4 tablets (400 mg total) by mouth every morning. Take 4 a day. Total dose of 400mg   . esomeprazole (NEXIUM) 40 MG capsule Take 1 capsule (40 mg total) by mouth daily.  . Loratadine 10 MG CAPS Take 10 mg by mouth.  . [DISCONTINUED] buPROPion (WELLBUTRIN) 100 MG tablet Take  3 tablets (300 mg total) by mouth every morning. Take 3 a day   No facility-administered encounter medications on file as of 04/02/2016.     No results found for this or any previous visit (from the past 2160 hour(s)).  There were no vitals taken for this visit.   Review of Systems  Constitutional: Negative for fever.  Cardiovascular: Negative for chest pain.  Gastrointestinal: Negative for nausea.  Skin: Negative for rash.  Neurological: Negative for tremors.  Psychiatric/Behavioral: Negative for substance abuse and suicidal ideas.    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 Obsessive-compulsive traits. Generalized anxiety disorder  Treatment Plan:  MDD:  Increase wellbutrin 4 00mg . Has not been taking prozac anymore. Doesn't feel his anxiety has gone high without prozac ,. Wants to go higher on wellbutrin. Explained side effects and concerns.  OCD: not worsened despite not being on prozac. GAD: baseline. Recommend continue therapy to deal with depression and possible low self esteem. He has found one and last session helped. Cannot do frequent sessions due to cost concerns.  Need to come regularly  More than  50% time spent in counseling and coordination of care including patient education. Supportive therapy and coping skills reviewed.  Discussed weight maintenance and Sleep Hygiene. Follow up with Primary care provider in regards to Medical conditions.  Time spent 25 minutes Follow up in 6 to 8  weeks.  [name], MD

## 2016-05-07 ENCOUNTER — Other Ambulatory Visit (HOSPITAL_COMMUNITY): Payer: Self-pay | Admitting: Psychiatry

## 2016-05-07 MED FILL — buPROPion HCL 100 MG TABS: 100 | 30 days supply | Qty: 120 | Fill #0

## 2016-05-13 NOTE — Telephone Encounter (Signed)
Received fax from [place] [facility] requesting a refill for Wellbutrin. Per Dr. De Nurse, refill request is denied. Rx for Wellbutrin 100mg , #120 w/ 1 refill was sent Leonard Lucas on 04/02/16. Pt is schedule for a f/u appt on 07/02/16.  lvm for pt to contact office.

## 2016-06-21 ENCOUNTER — Other Ambulatory Visit (HOSPITAL_COMMUNITY): Payer: Self-pay | Admitting: Psychiatry

## 2016-06-21 MED FILL — buPROPion HCL 100 MG TABS: 100 | 30 days supply | Qty: 120 | Fill #0

## 2016-06-21 NOTE — Telephone Encounter (Signed)
Received fax from [place] requesting refill for Wellbutrin. Per Dr. De Nurse, refill authorized for Wellbutrin 100mg , #120 is authorized. Prescription was sent to pharmacy. Pt is schedule for a f/u appt on 11/10. Called and informed pt of refill status. Pt verbalizes understanding.

## 2016-07-02 ENCOUNTER — Encounter (HOSPITAL_COMMUNITY): Payer: Self-pay | Admitting: Psychiatry

## 2016-07-02 ENCOUNTER — Ambulatory Visit (INDEPENDENT_AMBULATORY_CARE_PROVIDER_SITE_OTHER): Payer: 59 | Admitting: Psychiatry

## 2016-07-02 DIAGNOSIS — F411 Generalized anxiety disorder: Secondary | ICD-10-CM | POA: Diagnosis not present

## 2016-07-02 DIAGNOSIS — Z803 Family history of malignant neoplasm of breast: Secondary | ICD-10-CM | POA: Diagnosis not present

## 2016-07-02 DIAGNOSIS — F331 Major depressive disorder, recurrent, moderate: Secondary | ICD-10-CM

## 2016-07-02 DIAGNOSIS — Z818 Family history of other mental and behavioral disorders: Secondary | ICD-10-CM

## 2016-07-02 DIAGNOSIS — Z79899 Other long term (current) drug therapy: Secondary | ICD-10-CM

## 2016-07-02 MED ORDER — BUPROPION HCL 100 MG PO TABS: ORAL_TABLET | ORAL | 1 refills | Status: DC

## 2016-07-02 NOTE — Progress Notes (Signed)
Patient ID: Leonard Lucas, male   DOB: 03-15-1991, 25 y.o.   MRN: [id number] [name] Follow-up Outpatient Visit  Leonard Lucas [id number] 25 y.o.  07/02/2016  Chief Complaint: depression follow up   History of Present Illness:    Leonard Lucas is a 25 y/o male with a past psychiatric history significant for symptoms of depression and anxiety. The patient returns for psychiatric services and for medication management.   In regard to depression we increased the Wellbutrin to 400 mg last visit but apparently started having nausea so he has cut down back to 300. He denies that his depression is not gotten worse he is trying to work on school and also in therapy and trying to achieve some goals and that has helped him he feels less than this. Less hopeless not having any crying spells Tolerating 300mg  better.  Poor sleep and tired at times. Understands he may have sleep apnea. For now wants to work on weight loss.   Social anxiety: baseline. Has few friends  Depression: 6/10 (0=Very depressed; 5=Neutral; 10=Very Happy) Anxiety- 3/10 (0=no anxiety; 5= moderate/tolerable anxiety; 10= panic attacks)  . Duration;  Anxiety-since middle school. Recognized around 2010 . Timing: depends upon stress level . Context: Obsessing of how good is he or not . Modifying factors: Mood improves with not quitting.    Past Medical History:  Diagnosis Date  . Anxiety   . Depression    Family History  Problem Relation Age of Onset  . Breast cancer Mother   . Depression Mother   . Depression Paternal Aunt     Outpatient Encounter Prescriptions as of 07/02/2016  Medication Sig  . buPROPion (WELLBUTRIN) 100 MG tablet TAKE 3 TABLETS EVERY DAY  . esomeprazole (NEXIUM) 40 MG capsule Take 1 capsule (40 mg total) by mouth daily.  . Loratadine 10 MG CAPS Take 10 mg by mouth.  . [DISCONTINUED] buPROPion (WELLBUTRIN) 100 MG tablet TAKE 4 TABLETS BY MOUTH EVERY MORNING   No  facility-administered encounter medications on file as of 07/02/2016.     No results found for this or any previous visit (from the past 2160 hour(s)).  There were no vitals taken for this visit.   Review of Systems  Constitutional: Negative for fever.  Cardiovascular: Negative for chest pain.  Gastrointestinal: Negative for nausea.  Skin: Negative for rash.  Neurological: Negative for tingling.  Psychiatric/Behavioral: Negative for substance abuse and suicidal ideas.    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 Obsessive-compulsive traits. Generalized anxiety disorder  Treatment Plan:  MDD:  Cut down wellbutrin to 300mg  . Tolerates it better  OCD: not worsened despite not being on prozac. GAD: baseline. Recommend continue therapy to deal with depression and possible low self esteem.  Cannot afford frequent sessions.  More than 50% time spent in counseling and coordination of care including patient education. Supportive therapy and coping skills reviewed.  Discussed weight maintenance and Sleep Hygiene. Follow up with Primary care provider in regards to Medical conditions.  Time spent 25 minutes Follow up in 6 to 8 weeks.  [name], MD

## 2016-08-02 MED FILL — buPROPion HCL 100 MG TABS: 100 | 30 days supply | Qty: 90 | Fill #0

## 2016-08-30 MED FILL — buPROPion HCL 100 MG TABS: 100 | 30 days supply | Qty: 90 | Fill #1

## 2016-09-11 ENCOUNTER — Emergency Department
Admission: EM | Admit: 2016-09-11 | Discharge: 2016-09-11 | Disposition: A | Discharge status: Home / Self Care | Payer: 59 | Source: Home / Self Care | Attending: Family Medicine | Admitting: Family Medicine

## 2016-09-11 ENCOUNTER — Encounter: Payer: Self-pay | Admitting: Emergency Medicine

## 2016-09-11 DIAGNOSIS — J029 Acute pharyngitis, unspecified: Secondary | ICD-10-CM | POA: Diagnosis not present

## 2016-09-11 DIAGNOSIS — M791 Myalgia: Secondary | ICD-10-CM

## 2016-09-11 DIAGNOSIS — R05 Cough: Secondary | ICD-10-CM

## 2016-09-11 DIAGNOSIS — R070 Pain in throat: Secondary | ICD-10-CM | POA: Diagnosis not present

## 2016-09-11 DIAGNOSIS — R0982 Postnasal drip: Secondary | ICD-10-CM

## 2016-09-11 DIAGNOSIS — R6883 Chills (without fever): Secondary | ICD-10-CM | POA: Diagnosis not present

## 2016-09-11 DIAGNOSIS — R51 Headache: Secondary | ICD-10-CM

## 2016-09-11 DIAGNOSIS — R0981 Nasal congestion: Secondary | ICD-10-CM | POA: Diagnosis not present

## 2016-09-11 DIAGNOSIS — R69 Illness, unspecified: Secondary | ICD-10-CM | POA: Diagnosis not present

## 2016-09-11 DIAGNOSIS — J111 Influenza due to unidentified influenza virus with other respiratory manifestations: Secondary | ICD-10-CM

## 2016-09-11 MED ORDER — OSELTAMIVIR PHOSPHATE 75 MG PO CAPS: 75.0000 mg | ORAL_CAPSULE | Freq: Two times a day (BID) | ORAL | 0 refills | Status: DC

## 2016-09-11 MED ORDER — ONDANSETRON 4 MG PO TBDP
4.0000 mg | ORAL_TABLET | Freq: Once | ORAL | Status: AC
  Administered 2016-09-11: 4 mg via ORAL

## 2016-09-11 MED ORDER — BENZONATATE 200 MG PO CAPS: ORAL_CAPSULE | ORAL | 0 refills | Status: DC

## 2016-09-11 MED ORDER — ONDANSETRON 4 MG PO TBDP: ORAL_TABLET | ORAL | 0 refills | Status: DC

## 2016-09-11 NOTE — Discharge Instructions (Signed)
Take plain guaifenesin (1200mg  extended release tabs such as Mucinex) twice daily, with plenty of water, for cough and congestion.  May add Pseudoephedrine (30mg , one or two every 4 to 6 hours) for sinus congestion.  Get adequate rest.   May use Afrin nasal spray (or generic oxymetazoline) twice daily for about 5 days and then discontinue.  Also recommend using saline nasal spray several times daily and saline nasal irrigation (AYR is a common brand).  Use Flonase nasal spray each morning after using Afrin nasal spray and saline nasal irrigation. Try warm salt water gargles for sore throat.  Stop all antihistamines for now, and other non-prescription cough/cold preparations. May take Ibuprofen 200mg , 4 tabs every 8 hours with food for body aches, headache, etc.

## 2016-09-11 NOTE — ED Provider Notes (Signed)
[facility]    CSN: [id number] Arrival date & time: 09/11/16  1110     History   Chief Complaint Chief Complaint  Patient presents with  . URI    HPI Leonard Lucas is a 26 y.o. male.    Four days ago patient developed mild sore throat but did not feel ill.  Yesterday he suddenly developed headache, myalgias, cough, sinus congestion, and fatigue.   The history is provided by the patient.    Past Medical History:  Diagnosis Date  . Anxiety   . Depression     Patient Active Problem List   Diagnosis Date Noted  . Erosive gastritis   . Leukocytosis   . Acute blood loss anemia   . GI bleed 01/21/2015  . Symptomatic anemia 01/21/2015  . History of migraine 01/21/2015  . Duodenal ulcer hemorrhage   . Bleeding gastrointestinal   . Social anxiety disorder 07/11/2014  . Depression, major, recurrent, moderate ([place]) 03/16/2012  . Generalized abdominal pain 02/02/2010    Past Surgical History:  Procedure Laterality Date  . ESOPHAGOGASTRODUODENOSCOPY N/A 01/21/2015   Procedure: ESOPHAGOGASTRODUODENOSCOPY (EGD);  Surgeon: Lafayette Dragon, MD;  Location: [facility] ENDOSCOPY;  Service: Endoscopy;  Laterality: N/A;  . WISDOM TOOTH EXTRACTION  08/2010       Home Medications    Prior to Admission medications   Medication Sig Start Date End Date Taking? Authorizing Provider  benzonatate (TESSALON) 200 MG capsule Take one cap by mouth at bedtime as needed for cough.  May repeat in 4 to 6 hours 09/11/16   Kandra Nicolas, MD  buPROPion [facility]) 100 MG tablet TAKE 3 TABLETS EVERY DAY 07/02/16   [name], MD  oseltamivir (TAMIFLU) 75 MG capsule Take 1 capsule (75 mg total) by mouth every 12 (twelve) hours. 09/11/16   Kandra Nicolas, MD    Family History Family History  Problem Relation Age of Onset  . Breast cancer Mother   . Depression Mother   . Depression Paternal Aunt     Social History Social History  Substance Use Topics  . Smoking status: Never Smoker   . Smokeless tobacco: Never Used  . Alcohol use No     Allergies   Patient has no known allergies.   Review of Systems Review of Systems + sore throat + cough + sneezing No pleuritic pain No wheezing + nasal congestion + post-nasal drainage No sinus pain/pressure No itchy/red eyes No earache No hemoptysis No SOB No fever, + chills No nausea No vomiting No abdominal pain No diarrhea No urinary symptoms No skin rash + fatigue + myalgias + headache Used OTC meds without relief   Physical Exam Triage Vital Signs ED Triage Vitals  Enc Vitals Group     BP 09/11/16 1147 111/78     Pulse Rate 09/11/16 1147 100     Resp --      Temp 09/11/16 1147 98.1 F (36.7 C)     Temp Source 09/11/16 1147 Oral     SpO2 09/11/16 1147 96 %     Weight 09/11/16 1148 279 lb (126.6 kg)     Height 09/11/16 1148 6' (1.829 m)     Head Circumference --      Peak Flow --      Pain Score 09/11/16 1149 2     Pain Loc --      Pain Edu? --      Excl. in [place]? --    No data found.  Updated Vital Signs BP 111/78 (BP Location: Left Arm)   Pulse 100   Temp 98.1 F (36.7 C) (Oral)   Ht 6' (1.829 m)   Wt 279 lb (126.6 kg)   SpO2 96%   BMI 37.84 kg/m   Visual Acuity Right Eye Distance:   Left Eye Distance:   Bilateral Distance:    Right Eye Near:   Left Eye Near:    Bilateral Near:     Physical Exam Nursing notes and Vital Signs reviewed. Appearance:  Patient appears stated age, and in no acute distress Eyes:  Pupils are equal, round, and reactive to light and accomodation.  Extraocular movement is intact.  Conjunctivae are not inflamed  Ears:  Canals normal.  Tympanic membranes normal.  Nose:  Congested turbinates.  No sinus tenderness.    Pharynx:  Normal Neck:  Supple.  Tender enlarged posterior/lateral nodes are palpated bilaterally  Lungs:  Clear to auscultation.  Breath sounds are equal.  Moving air well. Heart:  Regular rate and rhythm without murmurs, rubs, or  gallops.  Abdomen:  Nontender without masses or hepatosplenomegaly.  Bowel sounds are present.  No CVA or flank tenderness.  Extremities:  No edema.  Skin:  No rash present.    UC Treatments / Results  Labs (all labs ordered are listed, but only abnormal results are displayed) Labs Reviewed - No data to display  EKG  EKG Interpretation None       Radiology No results found.  Procedures Procedures (including critical care time)  Medications Ordered in UC Medications - No data to display   Initial Impression / Assessment and Plan / UC Course  I have reviewed the triage vital signs and the nursing notes.  Pertinent labs & imaging results that were available during my care of the patient were reviewed by me and considered in my medical decision making (see chart for details).    Begin Tamiflu. Prescription written for Benzonatate [facility]) to take at bedtime for night-time cough.  Take plain guaifenesin (1200mg  extended release tabs such as Mucinex) twice daily, with plenty of water, for cough and congestion.  May add Pseudoephedrine (30mg , one or two every 4 to 6 hours) for sinus congestion.  Get adequate rest.   May use Afrin nasal spray (or generic oxymetazoline) twice daily for about 5 days and then discontinue.  Also recommend using saline nasal spray several times daily and saline nasal irrigation (AYR is a common brand).  Use Flonase nasal spray each morning after using Afrin nasal spray and saline nasal irrigation. Try warm salt water gargles for sore throat.  Stop all antihistamines for now, and other non-prescription cough/cold preparations. May take Ibuprofen 200mg , 4 tabs every 8 hours with food for body aches, headache, etc. Followup with Family Doctor if not improved in about 6 days.    Final Clinical Impressions(s) / UC Diagnoses   Final diagnoses:  Influenza-like illness    New Prescriptions New Prescriptions   BENZONATATE (TESSALON) 200 MG CAPSULE     Take one cap by mouth at bedtime as needed for cough.  May repeat in 4 to 6 hours   OSELTAMIVIR (TAMIFLU) 75 MG CAPSULE    Take 1 capsule (75 mg total) by mouth every 12 (twelve) hours.     Kandra Nicolas, MD 09/18/16 [phone]

## 2016-09-11 NOTE — ED Triage Notes (Signed)
Pt c/o sneezing, fatigue, productive cough, sinus drainage, body aches, congestion.

## 2016-10-01 ENCOUNTER — Encounter (HOSPITAL_COMMUNITY): Payer: Self-pay | Admitting: Psychiatry

## 2016-10-01 ENCOUNTER — Ambulatory Visit (INDEPENDENT_AMBULATORY_CARE_PROVIDER_SITE_OTHER): Payer: 59 | Admitting: Psychiatry

## 2016-10-01 VITALS — BP 118/70 | HR 74 | Resp 16 | Ht 72.0 in | Wt 275.0 lb

## 2016-10-01 DIAGNOSIS — F4010 Social phobia, unspecified: Secondary | ICD-10-CM

## 2016-10-01 DIAGNOSIS — F411 Generalized anxiety disorder: Secondary | ICD-10-CM

## 2016-10-01 DIAGNOSIS — F331 Major depressive disorder, recurrent, moderate: Secondary | ICD-10-CM

## 2016-10-01 DIAGNOSIS — Z803 Family history of malignant neoplasm of breast: Secondary | ICD-10-CM

## 2016-10-01 DIAGNOSIS — Z818 Family history of other mental and behavioral disorders: Secondary | ICD-10-CM

## 2016-10-01 DIAGNOSIS — Z79899 Other long term (current) drug therapy: Secondary | ICD-10-CM

## 2016-10-01 MED ORDER — BUPROPION HCL 100 MG PO TABS: ORAL_TABLET | ORAL | 1 refills | Status: DC

## 2016-10-01 MED FILL — buPROPion HCL 100 MG TABS: 100 | 30 days supply | Qty: 90 | Fill #0

## 2016-10-01 NOTE — Progress Notes (Signed)
Patient ID: Leonard Lucas, male   DOB: 07-27-1991, 26 y.o.   MRN: [id number] [place] Follow-up Outpatient Visit  KYRILLOS KALAFUT [id number] 26 y.o.  10/01/2016  Chief Complaint: depression follow up   History of Present Illness:    Leonard Lucas is a 26 y/o male with a past psychiatric history significant for symptoms of depression and anxiety. The patient returns for psychiatric services and for medication management.   Patient is doing somewhat reasonable on Wellbutrin because down the dose because 4 mg did not make a big difference. He has some short-term and long-term goals but he currently does not like his job does not endorse hopelessness or crying spells anxiety is present but fluctuates Sleep energy varies.    Social anxiety: baseline. Has few friends  Depressio7/10 (0=Very depressed; 5=Neutral; 10=Very Happy) Anxiety- 3/10 (0=no anxiety; 5= moderate/tolerable anxiety; 10= panic attacks)  . Duration;  Anxiety-since middle school. Recognized around 2010 . Timing: depends upon stress level . Context: Obsessing of how good is he or not . Modifying factors: Mood improves with not quitting.    Past Medical History:  Diagnosis Date  . Anxiety   . Depression    Family History  Problem Relation Age of Onset  . Breast cancer Mother   . Depression Mother   . Depression Paternal Aunt     Outpatient Encounter Prescriptions as of 10/01/2016  Medication Sig  . benzonatate (TESSALON) 200 MG capsule Take one cap by mouth at bedtime as needed for cough.  May repeat in 4 to 6 hours  . buPROPion (WELLBUTRIN) 100 MG tablet TAKE 3 TABLETS EVERY DAY  . ondansetron (ZOFRAN ODT) 4 MG disintegrating tablet Take one tab by mouth Q6hr prn nausea.  Dissolve under tongue.  Marland Kitchen oseltamivir (TAMIFLU) 75 MG capsule Take 1 capsule (75 mg total) by mouth every 12 (twelve) hours.  . [DISCONTINUED] buPROPion (WELLBUTRIN) 100 MG tablet TAKE 3 TABLETS EVERY DAY   No facility-administered  encounter medications on file as of 10/01/2016.     No results found for this or any previous visit (from the past 2160 hour(s)).  BP 118/70 (BP Location: Right Arm, Patient Position: Sitting, Cuff Size: Normal)   Pulse 74   Resp 16   Ht 6' (1.829 m)   Wt 275 lb (124.7 kg)   SpO2 98%   BMI 37.30 kg/m    Review of Systems  Constitutional: Negative for fever.  Cardiovascular: Negative for palpitations.  Gastrointestinal: Negative for nausea.  Skin: Negative for rash.  Neurological: Negative for tingling.  Psychiatric/Behavioral: Positive for depression. Negative for substance abuse and suicidal ideas.    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 Generalized anxiety disorder  Treatment Plan:  MDD: fair . Continue wellbutrin 300mg  OCD: not worsened GAd: fluctutate. Not worsened   More than 50% time spent in counseling and coordination of care including patient education. Supportive therapy and coping skills reviewed.  Discussed weight maintenance and Sleep Hygiene. Follow up with Primary care provider in regards to Medical conditions.  Time spent 25 minutes FOllow up 3 months or earlier if needed  Leonard Capron, MD

## 2016-10-19 ENCOUNTER — Emergency Department
Admission: EM | Admit: 2016-10-19 | Discharge: 2016-10-19 | Disposition: A | Discharge status: Home / Self Care | Payer: 59 | Source: Home / Self Care | Attending: Family Medicine | Admitting: Family Medicine

## 2016-10-19 DIAGNOSIS — L409 Psoriasis, unspecified: Secondary | ICD-10-CM

## 2016-10-19 MED ORDER — FLUOCINOLONE ACETONIDE SCALP 0.01 % EX OIL: TOPICAL_OIL | CUTANEOUS | 0 refills | Status: DC

## 2016-10-19 NOTE — ED Triage Notes (Signed)
Psoriasis break out on left ear, behind ear and back of neck.

## 2016-10-19 NOTE — ED Provider Notes (Signed)
[facility]ard [facility]    CSN: [id number] Arrival date & time: 10/19/16  1817     History   Chief Complaint Chief Complaint  Patient presents with  . Rash    HPI Leonard Lucas is a 26 y.o. male.   Patient has a long history of psoriasis with mild lesions on extensor surfaces.  During the past 4 months he has had persistent rash on his left scalp without pain or weeping.  He has had small lesions on his external ears also.   The history is provided by the patient.  Rash  Location: left scalp and ears. Quality: dryness, itchiness, redness and scaling   Quality: not blistering, not painful, not peeling, not swelling and not weeping   Severity:  Moderate Onset quality:  Gradual Duration:  4 months Timing:  Constant Progression:  Worsening Chronicity:  Chronic Relieved by:  Nothing Worsened by:  Nothing Ineffective treatments:  None tried Associated symptoms: no fever, no headaches and no induration     Past Medical History:  Diagnosis Date  . Anxiety   . Depression     Patient Active Problem List   Diagnosis Date Noted  . Erosive gastritis   . Leukocytosis   . Acute blood loss anemia   . GI bleed 01/21/2015  . Symptomatic anemia 01/21/2015  . History of migraine 01/21/2015  . Duodenal ulcer hemorrhage   . Bleeding gastrointestinal   . Social anxiety disorder 07/11/2014  . Depression, major, recurrent, moderate ([place]) 03/16/2012  . Generalized abdominal pain 02/02/2010    Past Surgical History:  Procedure Laterality Date  . ESOPHAGOGASTRODUODENOSCOPY N/A 01/21/2015   Procedure: ESOPHAGOGASTRODUODENOSCOPY (EGD);  Surgeon: Lafayette Dragon, MD;  Location: [facility] ENDOSCOPY;  Service: Endoscopy;  Laterality: N/A;  . WISDOM TOOTH EXTRACTION  08/2010       Home Medications    Prior to Admission medications   Medication Sig Start Date End Date Taking? Authorizing Provider  buPROPion (WELLBUTRIN) 100 MG tablet TAKE 3 TABLETS EVERY DAY 10/01/16   [name], MD    Fluocinolone Acetonide Scalp (DERMA-SMOOTHE/FS SCALP) 0.01 % OIL Apply to scalp at bedtime; wash off next morning. 10/19/16   Kandra Nicolas, MD  ondansetron (ZOFRAN ODT) 4 MG disintegrating tablet Take one tab by mouth Q6hr prn nausea.  Dissolve under tongue. 09/11/16   Kandra Nicolas, MD    Family History Family History  Problem Relation Age of Onset  . Breast cancer Mother   . Depression Mother   . Depression Paternal Aunt     Social History Social History  Substance Use Topics  . Smoking status: Never Smoker  . Smokeless tobacco: Never Used  . Alcohol use No     Allergies   Patient has no known allergies.   Review of Systems Review of Systems  Constitutional: Negative for fever.  Skin: Positive for rash.  Neurological: Negative for headaches.  All other systems reviewed and are negative.    Physical Exam Triage Vital Signs ED Triage Vitals  Enc Vitals Group     BP 10/19/16 1848 136/80     Pulse Rate 10/19/16 1848 69     Resp --      Temp 10/19/16 1848 98.2 F (36.8 C)     Temp Source 10/19/16 1848 Oral     SpO2 10/19/16 1848 98 %     Weight 10/19/16 1848 278 lb (126.1 kg)     Height 10/19/16 1848 6' (1.829 m)     Head Circumference --  Peak Flow --      Pain Score 10/19/16 1849 0     Pain Loc --      Pain Edu? --      Excl. in [place]? --    No data found.   Updated Vital Signs BP 136/80 (BP Location: Left Arm)   Pulse 69   Temp 98.2 F (36.8 C) (Oral)   Ht 6' (1.829 m)   Wt 278 lb (126.1 kg)   SpO2 98%   BMI 37.70 kg/m   Visual Acuity Right Eye Distance:   Left Eye Distance:   Bilateral Distance:    Right Eye Near:   Left Eye Near:    Bilateral Near:     Physical Exam  Constitutional: He appears well-developed and well-nourished. No distress.  HENT:  Head: Atraumatic.    Nose: Nose normal.  Mouth/Throat: Oropharynx is clear and moist.  Left scalp has raised erythematous scaly plaque as noted on diagram.  No induration or  tenderness to palpation.  Both external ears have area of scaling and erythema without tenderness or swelling.  Eyes: Conjunctivae and EOM are normal. Pupils are equal, round, and reactive to light.  Neck: Normal range of motion. Neck supple.  Cardiovascular: Normal rate.   Pulmonary/Chest: Effort normal.  Lymphadenopathy:    He has no cervical adenopathy.  Neurological: He is alert.  Skin: Skin is warm and dry. Rash noted.  Nursing note and vitals reviewed.    UC Treatments / Results  Labs (all labs ordered are listed, but only abnormal results are displayed) Labs Reviewed - No data to display  EKG  EKG Interpretation None       Radiology No results found.  Procedures Procedures (including critical care time)  Medications Ordered in UC Medications - No data to display   Initial Impression / Assessment and Plan / UC Course  I have reviewed the triage vital signs and the nursing notes.  Pertinent labs & imaging results that were available during my care of the patient were reviewed by me and considered in my medical decision making (see chart for details).    Begin Derma-Smooth/FS; apply at bedtime and wash off next morning. Followup with Family Doctor if not improved in two weeks.    Final Clinical Impressions(s) / UC Diagnoses   Final diagnoses:  Psoriasis of scalp    New Prescriptions New Prescriptions   FLUOCINOLONE ACETONIDE SCALP (DERMA-SMOOTHE/FS SCALP) 0.01 % OIL    Apply to scalp at bedtime; wash off next morning.     Kandra Nicolas, MD 10/25/16 [phone]

## 2016-10-20 MED FILL — FLUOCINOLONE 0.01% SCALP OI: 0.01 | 20 days supply | Qty: 118 | Fill #0

## 2016-11-03 MED FILL — buPROPion HCL 100 MG TABS: 100 | 30 days supply | Qty: 90 | Fill #1

## 2016-12-02 ENCOUNTER — Other Ambulatory Visit (HOSPITAL_COMMUNITY): Payer: Self-pay | Admitting: Psychiatry

## 2016-12-07 NOTE — Telephone Encounter (Signed)
Received fax from [facility] requesting a refill for Wellbutrin. Per Dr. De Nurse, refill request is authorize for Wellbutrin 100mg , #90. Rx was sent to pharmacy. Pt's next apt is schedule on 12/15/16. Called and informed pt of refill status. Pt verbalizes understanding.

## 2016-12-09 ENCOUNTER — Emergency Department (INDEPENDENT_AMBULATORY_CARE_PROVIDER_SITE_OTHER)
Admission: EM | Admit: 2016-12-09 | Discharge: 2016-12-09 | Disposition: A | Discharge status: Home / Self Care | Payer: 59 | Source: Home / Self Care | Attending: Family Medicine | Admitting: Family Medicine

## 2016-12-09 ENCOUNTER — Emergency Department (INDEPENDENT_AMBULATORY_CARE_PROVIDER_SITE_OTHER): Payer: 59

## 2016-12-09 ENCOUNTER — Encounter: Payer: Self-pay | Admitting: *Deleted

## 2016-12-09 DIAGNOSIS — K5900 Constipation, unspecified: Secondary | ICD-10-CM | POA: Diagnosis not present

## 2016-12-09 DIAGNOSIS — R1030 Lower abdominal pain, unspecified: Secondary | ICD-10-CM | POA: Diagnosis not present

## 2016-12-09 DIAGNOSIS — K567 Ileus, unspecified: Secondary | ICD-10-CM | POA: Diagnosis not present

## 2016-12-09 DIAGNOSIS — K5909 Other constipation: Secondary | ICD-10-CM | POA: Diagnosis not present

## 2016-12-09 LAB — COMPLETE METABOLIC PANEL WITH GFR
ALBUMIN: 4.7 g/dL (ref 3.6–5.1)
ALK PHOS: 100 U/L (ref 40–115)
ALT: 16 U/L (ref 9–46)
AST: 13 U/L (ref 10–40)
BUN: 7 mg/dL (ref 7–25)
CHLORIDE: 100 mmol/L (ref 98–110)
CO2: 29 mmol/L (ref 20–31)
Calcium: 10.1 mg/dL (ref 8.6–10.3)
Creat: 0.99 mg/dL (ref 0.60–1.35)
GFR, Est African American: >89 mL/min (ref >=60)
GLUCOSE: 102 mg/dL — AB (ref 65–99)
POTASSIUM: 4.3 mmol/L (ref 3.5–5.3)
SODIUM: 139 mmol/L (ref 135–146)
Total Bilirubin: 0.6 mg/dL (ref 0.2–1.2)
Total Protein: 7.5 g/dL (ref 6.1–8.1)

## 2016-12-09 LAB — POCT CBC W AUTO DIFF (K'VILLE URGENT CARE)

## 2016-12-09 LAB — POCT URINALYSIS DIP (MANUAL ENTRY)
Bilirubin, UA: NEGATIVE
GLUCOSE UA: NEGATIVE mg/dL
Ketones, POC UA: NEGATIVE mg/dL
Leukocytes, UA: NEGATIVE
Nitrite, UA: NEGATIVE
PH UA: 7 (ref 5.0–8.0)
PROTEIN UA: NEGATIVE mg/dL
RBC UA: NEGATIVE
Spec Grav, UA: 1.015 (ref 1.010–1.025)
UROBILINOGEN UA: 0.2 U/dL

## 2016-12-09 LAB — LIPASE: Lipase: 8 U/L (ref 7–60)

## 2016-12-09 LAB — TSH: TSH: 0.94 m[IU]/L (ref 0.40–4.50)

## 2016-12-09 LAB — AMYLASE: Amylase: 24 U/L (ref 0–105)

## 2016-12-09 MED ORDER — ONDANSETRON 4 MG PO TBDP
4.0000 mg | ORAL_TABLET | Freq: Once | ORAL | Status: AC
  Administered 2016-12-09: 4 mg via ORAL

## 2016-12-09 NOTE — ED Triage Notes (Signed)
Patient c/o lower abdominal cramping with nausea x yesterday. Reports he feels constipated. He feels the need to produced a bowel movement but cannot. He took Dulcolax and a generic stool softner yesterday and produce 1 small stool. Before yesterday last BM was 3 days ago.

## 2016-12-09 NOTE — Discharge Instructions (Signed)
Begin clear liquids for about 24 hours, then may begin a BRAT diet (Bananas, Rice, Applesauce, Toast) when abdominal pain and bloating decrease.  Then gradually advance to a regular diet as tolerated.    When symptoms improve, may begin Miralax (one capful dissolved in 4 to 8 ounces water) once daily. If symptoms become significantly worse during the night or over the weekend, proceed to the local emergency room.

## 2016-12-09 NOTE — ED Provider Notes (Signed)
[name]    CSN: [id number] Arrival date & time: 12/09/16  1137     History   Chief Complaint Chief Complaint  Patient presents with  . Nausea  . Abdominal Pain    HPI Leonard Lucas is a 26 y.o. male.   After eating lunch yesterday patient developed an urge to have bowel movement, but passed only a small amount of stool.  He feels constipated and has had a constant urge to have bowel movement, associated with colicky lower abdominal pain.  He also developed nausea yesterday after eating, with frequent "burping."  His last normal bowel movement was three days ago, and he denies recent changes.  No fevers, chills, and sweats.  No history of abdominal surgery.  He tried two stool softener caps, and later Dulcolax without improvement. He has a family history of hypothyroid (mother)   The history is provided by the patient.  Abdominal Pain  Pain location:  RLQ and LLQ Pain quality: aching, bloating and cramping   Pain radiates to:  Does not radiate Pain severity:  Moderate Onset quality:  Sudden Duration:  1 day Timing:  Constant Progression:  Unchanged Chronicity:  New Context: awakening from sleep, eating and laxative use   Context: not diet changes, not medication withdrawal, not previous surgeries, not recent illness, not recent travel, not sick contacts, not suspicious food intake and not trauma   Relieved by:  Nothing Worsened by:  Eating Ineffective treatments: stool softener. Associated symptoms: anorexia, belching, constipation, fatigue, flatus, melena, nausea and vomiting   Associated symptoms: no chest pain, no chills, no cough, no diarrhea, no dysuria, no fever, no hematemesis, no hematochezia, no hematuria, no shortness of breath and no sore throat   Risk factors: obesity     Past Medical History:  Diagnosis Date  . Anxiety   . Depression     Patient Active Problem List   Diagnosis Date Noted  . Erosive gastritis   . Leukocytosis   . Acute  blood loss anemia   . GI bleed 01/21/2015  . Symptomatic anemia 01/21/2015  . History of migraine 01/21/2015  . Duodenal ulcer hemorrhage   . Bleeding gastrointestinal   . Social anxiety disorder 07/11/2014  . Depression, major, recurrent, moderate ([place]) 03/16/2012  . Generalized abdominal pain 02/02/2010    Past Surgical History:  Procedure Laterality Date  . ESOPHAGOGASTRODUODENOSCOPY N/A 01/21/2015   Procedure: ESOPHAGOGASTRODUODENOSCOPY (EGD);  Surgeon: Lafayette Dragon, MD;  [facility] ENDOSCOPY;  Service: Endoscopy;  Laterality: N/A;  . WISDOM TOOTH EXTRACTION  08/2010       Home Medications    Prior to Admission medications   Medication Sig Start Date End Date Taking? Authorizing Provider  buPROPion (WELLBUTRIN) 100 MG tablet TAKE 3 TABLETS BY MOUTH EVERY DAY 12/07/16   [name], MD  Fluocinolone Acetonide Scalp (DERMA-SMOOTHE/FS SCALP) 0.01 % OIL Apply to scalp at bedtime; wash off next morning. 10/19/16   Kandra Nicolas, MD    Family History Family History  Problem Relation Age of Onset  . Breast cancer Mother   . Depression Mother   . Depression Paternal Aunt     Social History Social History  Substance Use Topics  . Smoking status: Never Smoker  . Smokeless tobacco: Never Used  . Alcohol use No     Allergies   Patient has no known allergies.   Review of Systems Review of Systems  Constitutional: Positive for fatigue. Negative for chills and fever.  HENT: Negative for  sore throat.   Respiratory: Negative for cough and shortness of breath.   Cardiovascular: Negative for chest pain.  Gastrointestinal: Positive for abdominal pain, anorexia, constipation, flatus, melena, nausea and vomiting. Negative for diarrhea, hematemesis and hematochezia.  Genitourinary: Negative for dysuria and hematuria.     Physical Exam Triage Vital Signs ED Triage Vitals [12/09/16 1157]  Enc Vitals Group     BP 122/86     Pulse Rate 89     Resp      Temp 98.4 F  (36.9 C)     Temp Source Oral     SpO2 97 %     Weight 272 lb (123.4 kg)     Height      Head Circumference      Peak Flow      Pain Score 3     Pain Loc      Pain Edu?      Excl. in [place]?    No data found.   Updated Vital Signs BP 122/86 (BP Location: Left Arm)   Pulse 89   Temp 98.4 F (36.9 C) (Oral)   Wt 272 lb (123.4 kg)   SpO2 97%   BMI 36.89 kg/m   Visual Acuity Right Eye Distance:   Left Eye Distance:   Bilateral Distance:    Right Eye Near:   Left Eye Near:    Bilateral Near:     Physical Exam  Constitutional: He appears well-developed and well-nourished. No distress.  HENT:  Head: Normocephalic.  Right Ear: External ear normal.  Left Ear: External ear normal.  Nose: Nose normal.  Mouth/Throat: Oropharynx is clear and moist.  Eyes: Conjunctivae are normal. Pupils are equal, round, and reactive to light.  Neck: Neck supple.  Cardiovascular: Normal heart sounds.   Pulmonary/Chest: Breath sounds normal.  Abdominal: Soft. Normal appearance and bowel sounds are normal. He exhibits no distension and no mass. There is no hepatosplenomegaly. There is tenderness in the right lower quadrant, suprapubic area and left lower quadrant. There is tenderness at McBurney's point. There is no rigidity, no rebound, no guarding and negative Murphy's sign.    Negative iliopsoas and obdurator tests.  Musculoskeletal: He exhibits no edema.  Lymphadenopathy:    He has no cervical adenopathy.  Neurological: He is alert.  Skin: Skin is warm and dry. He is not diaphoretic.  Nursing note and vitals reviewed.    UC Treatments / Results  Labs (all labs ordered are listed, but only abnormal results are displayed) Labs Reviewed  COMPLETE METABOLIC PANEL WITH GFR  AMYLASE  LIPASE  TSH  POCT CBC W AUTO DIFF ([facility]):  WBC 10.0; LY 15.0; MO 8.1; GR 76.9; Hgb 15.7; Platelets 421   POCT URINALYSIS DIP (MANUAL ENTRY) negative    EKG  EKG Interpretation None        Radiology Dg Abdomen 1 View  Result Date: 12/09/2016 CLINICAL DATA:  Abdominal pain EXAM: ABDOMEN - 1 VIEW COMPARISON:  None. FINDINGS: Moderate stool in the right colon. Gas distended transverse and left colon. Small bowel nondilated. Probable ileus and constipation. No acute skeletal abnormality.  No urinary tract calculi. IMPRESSION: Constipation and ileus. Electronically Signed   By: Franchot Gallo M.D.   On: 12/09/2016 13:21    Procedures Procedures (including critical care time)  Medications Ordered in UC Medications  ondansetron (ZOFRAN-ODT) disintegrating tablet 4 mg (4 mg Oral Given 12/09/16 1158)     Initial Impression / Assessment and Plan / UC Course  I have reviewed the triage vital signs and the nursing notes.  Pertinent labs & imaging results that were available during my care of the patient were reviewed by me and considered in my medical decision making (see chart for details).    Normal WBC and urinalysis reassuring. CMP, amylase, lipase, TSH pending. KUB shows probable ileus and constipation. Begin clear liquids for about 24 hours, then may begin a BRAT diet (Bananas, Rice, Applesauce, Toast) when abdominal pain and bloating decrease.  Then gradually advance to a regular diet as tolerated.    When symptoms improve, may begin Miralax (one capful dissolved in 4 to 8 ounces water) once daily. If symptoms become significantly worse during the night or over the weekend, proceed to the local emergency room.  Followup with Family Doctor if not improved in 4 days.    Final Clinical Impressions(s) / UC Diagnoses   Final diagnoses:  Lower abdominal pain  Other constipation    New Prescriptions New Prescriptions   No medications on file     Kandra Nicolas, MD 12/09/16 [phone]

## 2016-12-10 ENCOUNTER — Encounter (HOSPITAL_BASED_OUTPATIENT_CLINIC_OR_DEPARTMENT_OTHER): Payer: Self-pay | Admitting: Emergency Medicine

## 2016-12-10 ENCOUNTER — Telehealth: Payer: Self-pay | Admitting: *Deleted

## 2016-12-10 ENCOUNTER — Emergency Department (HOSPITAL_BASED_OUTPATIENT_CLINIC_OR_DEPARTMENT_OTHER): Payer: 59

## 2016-12-10 ENCOUNTER — Observation Stay (HOSPITAL_BASED_OUTPATIENT_CLINIC_OR_DEPARTMENT_OTHER)
Admission: EM | Admit: 2016-12-10 | Discharge: 2016-12-11 | Disposition: A | Discharge status: Home / Self Care | Payer: 59 | Attending: Internal Medicine | Admitting: Internal Medicine

## 2016-12-10 DIAGNOSIS — Z803 Family history of malignant neoplasm of breast: Secondary | ICD-10-CM | POA: Diagnosis not present

## 2016-12-10 DIAGNOSIS — Z79899 Other long term (current) drug therapy: Secondary | ICD-10-CM | POA: Diagnosis not present

## 2016-12-10 DIAGNOSIS — K5900 Constipation, unspecified: Secondary | ICD-10-CM

## 2016-12-10 DIAGNOSIS — F331 Major depressive disorder, recurrent, moderate: Secondary | ICD-10-CM

## 2016-12-10 DIAGNOSIS — D72829 Elevated white blood cell count, unspecified: Secondary | ICD-10-CM | POA: Insufficient documentation

## 2016-12-10 DIAGNOSIS — F419 Anxiety disorder, unspecified: Secondary | ICD-10-CM | POA: Insufficient documentation

## 2016-12-10 DIAGNOSIS — R1110 Vomiting, unspecified: Secondary | ICD-10-CM

## 2016-12-10 DIAGNOSIS — K5649 Other impaction of intestine: Secondary | ICD-10-CM | POA: Diagnosis not present

## 2016-12-10 DIAGNOSIS — Z8711 Personal history of peptic ulcer disease: Secondary | ICD-10-CM | POA: Diagnosis not present

## 2016-12-10 DIAGNOSIS — Z6837 Body mass index (BMI) 37.0-37.9, adult: Secondary | ICD-10-CM | POA: Diagnosis not present

## 2016-12-10 DIAGNOSIS — K8020 Calculus of gallbladder without cholecystitis without obstruction: Secondary | ICD-10-CM | POA: Diagnosis not present

## 2016-12-10 DIAGNOSIS — Z818 Family history of other mental and behavioral disorders: Secondary | ICD-10-CM | POA: Diagnosis not present

## 2016-12-10 DIAGNOSIS — K5641 Fecal impaction: Principal | ICD-10-CM | POA: Diagnosis present

## 2016-12-10 DIAGNOSIS — K567 Ileus, unspecified: Secondary | ICD-10-CM | POA: Insufficient documentation

## 2016-12-10 DIAGNOSIS — E669 Obesity, unspecified: Secondary | ICD-10-CM | POA: Insufficient documentation

## 2016-12-10 DIAGNOSIS — F329 Major depressive disorder, single episode, unspecified: Secondary | ICD-10-CM | POA: Diagnosis not present

## 2016-12-10 DIAGNOSIS — R1114 Bilious vomiting: Secondary | ICD-10-CM | POA: Diagnosis not present

## 2016-12-10 LAB — CBC
HCT: 43.1 % (ref 39.0–52.0)
Hemoglobin: 14.4 g/dL (ref 13.0–17.0)
MCH: 28.1 pg (ref 26.0–34.0)
MCHC: 33.4 g/dL (ref 30.0–36.0)
MCV: 84.2 fL (ref 78.0–100.0)
PLATELETS: 360 10*3/uL (ref 150–400)
RBC: 5.12 MIL/uL (ref 4.22–5.81)
RDW: 12.8 % (ref 11.5–15.5)
WBC: 12.1 10*3/uL — AB (ref 4.0–10.5)

## 2016-12-10 LAB — CREATININE, SERUM
Creatinine, Ser: 1.06 mg/dL (ref 0.61–1.24)
GFR calc non Af Amer: >60 mL/min (ref >60)

## 2016-12-10 LAB — CBC WITH DIFFERENTIAL/PLATELET
Basophils Absolute: 0 10*3/uL (ref 0.0–0.1)
Basophils Relative: 0 %
EOS PCT: 0 %
Eosinophils Absolute: 0 10*3/uL (ref 0.0–0.7)
HEMATOCRIT: 46 % (ref 39.0–52.0)
Hemoglobin: 15.6 g/dL (ref 13.0–17.0)
LYMPHS PCT: 9 %
Lymphs Abs: 1.1 10*3/uL (ref 0.7–4.0)
MCH: 28.9 pg (ref 26.0–34.0)
MCHC: 33.9 g/dL (ref 30.0–36.0)
MCV: 85.2 fL (ref 78.0–100.0)
MONOS PCT: 7 %
Monocytes Absolute: 0.8 10*3/uL (ref 0.1–1.0)
NEUTROS ABS: 10.1 10*3/uL — AB (ref 1.7–7.7)
Neutrophils Relative %: 84 %
PLATELETS: 388 10*3/uL (ref 150–400)
RBC: 5.4 MIL/uL (ref 4.22–5.81)
RDW: 13 % (ref 11.5–15.5)
WBC: 12 10*3/uL — ABNORMAL HIGH (ref 4.0–10.5)

## 2016-12-10 LAB — COMPREHENSIVE METABOLIC PANEL
ALT: 19 U/L (ref 17–63)
ANION GAP: 11 (ref 5–15)
AST: 17 U/L (ref 15–41)
Albumin: 4.6 g/dL (ref 3.5–5.0)
Alkaline Phosphatase: 92 U/L (ref 38–126)
BILIRUBIN TOTAL: 0.7 mg/dL (ref 0.3–1.2)
BUN: 8 mg/dL (ref 6–20)
CHLORIDE: 96 mmol/L — AB (ref 101–111)
CO2: 29 mmol/L (ref 22–32)
Calcium: 9.5 mg/dL (ref 8.9–10.3)
Creatinine, Ser: 1.11 mg/dL (ref 0.61–1.24)
Glucose, Bld: 117 mg/dL — ABNORMAL HIGH (ref 65–99)
POTASSIUM: 3.7 mmol/L (ref 3.5–5.1)
Sodium: 136 mmol/L (ref 135–145)
TOTAL PROTEIN: 7.8 g/dL (ref 6.5–8.1)

## 2016-12-10 LAB — LIPASE, BLOOD: LIPASE: 15 U/L (ref 11–51)

## 2016-12-10 MED ORDER — IOPAMIDOL (ISOVUE-300) INJECTION 61%
100.0000 mL | Freq: Once | INTRAVENOUS | Status: AC | PRN
  Administered 2016-12-10: 100 mL via INTRAVENOUS

## 2016-12-10 MED ORDER — ENOXAPARIN SODIUM 60 MG/0.6ML ~~LOC~~ SOLN
60.0000 mg | SUBCUTANEOUS | Status: DC
  Administered 2016-12-10: 60 mg via SUBCUTANEOUS
  Filled 2016-12-10 (×2): qty 0.6

## 2016-12-10 MED ORDER — MORPHINE SULFATE (PF) 4 MG/ML IV SOLN
4.0000 mg | Freq: Once | INTRAVENOUS | Status: AC
  Administered 2016-12-10: 4 mg via INTRAVENOUS
  Filled 2016-12-10: qty 1

## 2016-12-10 MED ORDER — ACETAMINOPHEN 325 MG PO TABS
650.0000 mg | ORAL_TABLET | ORAL | Status: DC | PRN
Start: 2016-12-10 — End: 2016-12-11
  Administered 2016-12-10 – 2016-12-11 (×2): 650 mg via ORAL
  Filled 2016-12-10 (×2): qty 2

## 2016-12-10 MED ORDER — SODIUM CHLORIDE 0.9 % IV BOLUS (SEPSIS)
1000.0000 mL | Freq: Once | INTRAVENOUS | Status: AC
  Administered 2016-12-10: 1000 mL via INTRAVENOUS

## 2016-12-10 MED ORDER — ONDANSETRON HCL 4 MG/2ML IJ SOLN
4.0000 mg | Freq: Once | INTRAMUSCULAR | Status: AC
  Administered 2016-12-10: 4 mg via INTRAVENOUS
  Filled 2016-12-10: qty 2

## 2016-12-10 NOTE — ED Provider Notes (Signed)
[facility] Provider Note   CSN: [id number] Arrival date & time: 12/10/16  4098     History   Chief Complaint Chief Complaint  Patient presents with  . Constipation    HPI Leonard Lucas is a 26 y.o. male.  The history is provided by the patient.  Constipation   This is a new problem. The current episode started more than 2 days ago. Stool description: constipated. Associated symptoms include abdominal pain. Pertinent negatives include no dysuria. There is fiber in the patient's diet. He does not exercise regularly. There has been adequate water intake. He has tried nothing for the symptoms. The treatment provided no relief.   26 year old male who presents with nausea, vomiting, abdominal pain. States that he has been very constipated over the past 3 days and has had severe abdominal cramping associated with this in the low abdomen and right side. He has had multiple episodes of nausea and vomiting that nonbilious and nonbloody. He has not had fevers or chills. Has not had any urinary complaints. No prior abdominal surgeries. Was seen yesterday at urgent care [facility] with x-ray suggesting moderate constipation over the right colon as well as associating ileus. He was told to just be on a water diet for the next 24 hours, advance to [employer], and start taking MiraLAX. States that he was unable to tolerate any by mouth when he returned home, and has been having worsening abdominal pain and vomiting. No testicular pain or swelling   Past Medical History:  Diagnosis Date  . Anxiety   . Depression     Patient Active Problem List   Diagnosis Date Noted  . Fecal impaction of colon ([place]) 12/10/2016  . Erosive gastritis   . Leukocytosis   . Acute blood loss anemia   . GI bleed 01/21/2015  . Symptomatic anemia 01/21/2015  . History of migraine 01/21/2015  . Duodenal ulcer hemorrhage   . Bleeding gastrointestinal   . Social anxiety disorder 07/11/2014  . Depression,  major, recurrent, moderate ([place]) 03/16/2012  . Generalized abdominal pain 02/02/2010    Past Surgical History:  Procedure Laterality Date  . ESOPHAGOGASTRODUODENOSCOPY N/A 01/21/2015   Procedure: ESOPHAGOGASTRODUODENOSCOPY (EGD);  Surgeon: Lafayette Dragon, MD;  Location: [facility] ENDOSCOPY;  Service: Endoscopy;  Laterality: N/A;  . WISDOM TOOTH EXTRACTION  08/2010       Home Medications    Prior to Admission medications   Medication Sig Start Date End Date Taking? Authorizing Provider  buPROPion (WELLBUTRIN) 100 MG tablet TAKE 3 TABLETS BY MOUTH EVERY DAY 12/07/16   [name], MD  Fluocinolone Acetonide Scalp (DERMA-SMOOTHE/FS SCALP) 0.01 % OIL Apply to scalp at bedtime; wash off next morning. 10/19/16   Kandra Nicolas, MD    Family History Family History  Problem Relation Age of Onset  . Breast cancer Mother   . Depression Mother   . Depression Paternal Aunt     Social History Social History  Substance Use Topics  . Smoking status: Never Smoker  . Smokeless tobacco: Never Used  . Alcohol use No     Allergies   Patient has no known allergies.   Review of Systems Review of Systems  Constitutional: Negative for fever.  Respiratory: Negative for shortness of breath.   Gastrointestinal: Positive for abdominal pain, constipation, nausea and vomiting.  Genitourinary: Negative for difficulty urinating and dysuria.  Allergic/Immunologic: Negative for immunocompromised state.  Hematological: Does not bruise/bleed easily.  Psychiatric/Behavioral: Negative for confusion.  All other systems reviewed  and are negative.    Physical Exam Updated Vital Signs BP 134/90 (BP Location: Left Arm)   Pulse 82   Temp 98.8 F (37.1 C) (Oral)   Resp 18   Ht 6' (1.829 m)   Wt 274 lb (124.3 kg)   SpO2 100%   BMI 37.16 kg/m   Physical Exam Physical Exam  Nursing note and vitals reviewed. Constitutional: non-toxic, and in no acute distress Head: Normocephalic and atraumatic.    Mouth/Throat: Oropharynx is clear and moist.  Neck: Normal range of motion. Neck supple.  Cardiovascular: Normal rate and regular rhythm.   Pulmonary/Chest: Effort normal and breath sounds normal.  Abdominal: Soft. There is low and right sided tenderness. There is no rebound and no guarding. Mild distension . No fecal impaction on rectal exam Musculoskeletal: Normal range of motion.  Neurological: Alert, no facial droop, fluent speech, moves all extremities symmetrically Skin: Skin is warm and dry.  Psychiatric: Cooperative   ED Treatments / Results  Labs (all labs ordered are listed, but only abnormal results are displayed) Labs Reviewed  CBC WITH DIFFERENTIAL/PLATELET - Abnormal; Notable for the following:       Result Value   WBC 12.0 (*)    Neutro Abs 10.1 (*)    All other components within normal limits  COMPREHENSIVE METABOLIC PANEL - Abnormal; Notable for the following:    Chloride 96 (*)    Glucose, Bld 117 (*)    All other components within normal limits  LIPASE, BLOOD    EKG  EKG Interpretation None       Radiology Dg Abdomen 1 View  Result Date: 12/09/2016 CLINICAL DATA:  Abdominal pain EXAM: ABDOMEN - 1 VIEW COMPARISON:  None. FINDINGS: Moderate stool in the right colon. Gas distended transverse and left colon. Small bowel nondilated. Probable ileus and constipation. No acute skeletal abnormality.  No urinary tract calculi. IMPRESSION: Constipation and ileus. Electronically Signed   By: Franchot Gallo M.D.   On: 12/09/2016 13:21   Ct Abdomen Pelvis W Contrast  Result Date: 12/10/2016 CLINICAL DATA:  Three days of nausea with vomiting and abdominal pain EXAM: CT ABDOMEN AND PELVIS WITH CONTRAST TECHNIQUE: Multidetector CT imaging of the abdomen and pelvis was performed using the standard protocol following bolus administration of intravenous contrast. CONTRAST:  130mL ISOVUE-300 IOPAMIDOL (ISOVUE-300) INJECTION 61% COMPARISON:  KUB from yesterday FINDINGS: Lower  chest:  No contributory findings. Hepatobiliary: No focal liver abnormality.Subtle gas density in the gallbladder consistent with nitrogen within a gallstone. Pancreas: Unremarkable. Spleen: Unremarkable. Adrenals/Urinary Tract: Negative adrenals. No hydronephrosis or stone. Unremarkable bladder. Stomach/Bowel: The colon is dilated and gas-filled to the descending segment where there is desiccated stool containing at least 3 pills. Proximal to this level the colon wall is focally thickened from submucosal edema, with prominent enhancement. Regional mild fat inflammation. The distal colon is decompressed. No perforation or abscess is noted. No discrete enhancing masslike lesion. Negative appendix. Vascular/Lymphatic: No acute vascular abnormality. No mass or adenopathy. Reproductive:No pathologic findings. Other: Trace pelvic fluid, considered reactive. Musculoskeletal: No acute abnormalities. IMPRESSION: 1. Fecalith obstructing the descending colon. Regional colitis may be obstructive/stercoral colitis, but underlying primary colitis is not excluded. 2. Cholelithiasis. Electronically Signed   By: Monte Fantasia M.D.   On: 12/10/2016 09:53    Procedures Procedures (including critical care time)  Medications Ordered in ED Medications  ondansetron (ZOFRAN) injection 4 mg (4 mg Intravenous Given 12/10/16 0928)  sodium chloride 0.9 % bolus 1,000 mL (0 mLs Intravenous Stopped 12/10/16  1042)  morphine 4 MG/ML injection 4 mg (4 mg Intravenous Given 12/10/16 0928)  iopamidol (ISOVUE-300) 61 % injection 100 mL (100 mLs Intravenous Contrast Given 12/10/16 0934)     Initial Impression / Assessment and Plan / ED Course  I have reviewed the triage vital signs and the nursing notes.  Pertinent labs & imaging results that were available during my care of the patient were reviewed by me and considered in my medical decision making (see chart for details).     26 year old male, diffusely healthy, who presents with  3 days of worsening vomiting and abdominal pain with constipation. Records reviewed including urgent care visit from yesterday. With reassuring urine and metabolic panel. X-ray visualized showing evidence of fecal loading over the right hemiabdomen, as well as mildly dilated loops of small bowel. We'll obtain CT abdomen and pelvis to rule out obstruction.   CT visualized. He has evidence of a fecalith in the descending colon that is causing colonic obstruction. There is some associating inflammation of the colon there. Discussed with Dr. gross from general surgery, who recommended initial medical management for this including bowel prep and enemas with potential GI consult. Requested to be reconsulted if medical management fails.  Discussed with Dr. Wynelle Cleveland who will admit to [facility]  Final Clinical Impressions(s) / ED Diagnoses   Final diagnoses:  Other impaction of intestine [facility])    New Prescriptions New Prescriptions   No medications on file     Forde Dandy, MD 12/10/16 1054

## 2016-12-10 NOTE — Telephone Encounter (Signed)
Attempted to call pt with lab results phone number has been disconnected.

## 2016-12-10 NOTE — Progress Notes (Signed)
Leonard Lucas  Oct 25, 1990 [id number]  Patient Care Team: Pcp Not In System as PCP - General [name], MD as Consulting Physician (Psychiatry) Vena Rua, PA-C as Physician Assistant (Gastroenterology)  This patient is a 26 y.o.male who calls today for surgical evaluation.   Reason for call: Fecalith ? colitis  Called by Dr Oleta Mouse at [facility] ED.  concern for 26 year old male with three day history of worsening constipation.  History of esophagitis & gastritis and depression and social anxiety.  Not constipated.  Worsening pain.  Emergency room.  CT scan reveals fecaliths and mid descending colon with some wall thickening.  Emergency physician called for advice since the radiology report discussed a fecalith.  Patient is constipated with a transition zone.  I recommend that patient be evaluated by gastroenterology to see if there is an endoluminal mass.  See if there is some focal segmental colitis such as Crohn's colitis.  Cancer would be unusual but not unheard of despite his young age.  Should there be a persistent problem that cannot be resolved with  enemas/bowel regimen or further intervention such as  immunosuppression for inflammatory bowel disease, consider formal consultation with surgery.    Patient Active Problem List   Diagnosis Date Noted  . Erosive gastritis   . Leukocytosis   . Acute blood loss anemia   . GI bleed 01/21/2015  . Symptomatic anemia 01/21/2015  . History of migraine 01/21/2015  . Duodenal ulcer hemorrhage   . Bleeding gastrointestinal   . Social anxiety disorder 07/11/2014  . Depression, major, recurrent, moderate ([place]) 03/16/2012  . Generalized abdominal pain 02/02/2010    Past Medical History:  Diagnosis Date  . Anxiety   . Depression     Past Surgical History:  Procedure Laterality Date  . ESOPHAGOGASTRODUODENOSCOPY N/A 01/21/2015   Procedure: ESOPHAGOGASTRODUODENOSCOPY (EGD);  Surgeon: Lafayette Dragon, MD;  Location[facility] ENDOSCOPY;   Service: Endoscopy;  Laterality: N/A;  . WISDOM TOOTH EXTRACTION  08/2010    Social History   Social History  . Marital status: Single    Spouse name: N/A  . Number of children: N/A  . Years of education: N/A   Occupational History  . Not on file.   Social History Main Topics  . Smoking status: Never Smoker  . Smokeless tobacco: Never Used  . Alcohol use No  . Drug use: No  . Sexual activity: No   Other Topics Concern  . Not on file   Social History Narrative  . No narrative on file    Family History  Problem Relation Age of Onset  . Breast cancer Mother   . Depression Mother   . Depression Paternal Aunt     No current facility-administered medications for this encounter.    Current Outpatient Prescriptions  Medication Sig Dispense Refill  . buPROPion (WELLBUTRIN) 100 MG tablet TAKE 3 TABLETS BY MOUTH EVERY DAY 90 tablet 0  . Fluocinolone Acetonide Scalp (DERMA-SMOOTHE/FS SCALP) 0.01 % OIL Apply to scalp at bedtime; wash off next morning. 118 mL 0     No Known Allergies  BP 134/90 (BP Location: Left Arm)   Pulse 82   Temp 98.8 F (37.1 C) (Oral)   Resp 18   Ht 6' (1.829 m)   Wt 124.3 kg (274 lb)   SpO2 100%   BMI 37.16 kg/m   Dg Abdomen 1 View  Result Date: 12/09/2016 CLINICAL DATA:  Abdominal pain EXAM: ABDOMEN - 1 VIEW COMPARISON:  None. FINDINGS: Moderate stool  in the right colon. Gas distended transverse and left colon. Small bowel nondilated. Probable ileus and constipation. No acute skeletal abnormality.  No urinary tract calculi. IMPRESSION: Constipation and ileus. Electronically Signed   By: Franchot Gallo M.D.   On: 12/09/2016 13:21   Ct Abdomen Pelvis W Contrast  Result Date: 12/10/2016 CLINICAL DATA:  Three days of nausea with vomiting and abdominal pain EXAM: CT ABDOMEN AND PELVIS WITH CONTRAST TECHNIQUE: Multidetector CT imaging of the abdomen and pelvis was performed using the standard protocol following bolus administration of intravenous  contrast. CONTRAST:  178mL ISOVUE-300 IOPAMIDOL (ISOVUE-300) INJECTION 61% COMPARISON:  KUB from yesterday FINDINGS: Lower chest:  No contributory findings. Hepatobiliary: No focal liver abnormality.Subtle gas density in the gallbladder consistent with nitrogen within a gallstone. Pancreas: Unremarkable. Spleen: Unremarkable. Adrenals/Urinary Tract: Negative adrenals. No hydronephrosis or stone. Unremarkable bladder. Stomach/Bowel: The colon is dilated and gas-filled to the descending segment where there is desiccated stool containing at least 3 pills. Proximal to this level the colon wall is focally thickened from submucosal edema, with prominent enhancement. Regional mild fat inflammation. The distal colon is decompressed. No perforation or abscess is noted. No discrete enhancing masslike lesion. Negative appendix. Vascular/Lymphatic: No acute vascular abnormality. No mass or adenopathy. Reproductive:No pathologic findings. Other: Trace pelvic fluid, considered reactive. Musculoskeletal: No acute abnormalities. IMPRESSION: 1. Fecalith obstructing the descending colon. Regional colitis may be obstructive/stercoral colitis, but underlying primary colitis is not excluded. 2. Cholelithiasis. Electronically Signed   By: Monte Fantasia M.D.   On: 12/10/2016 09:53    Note: This dictation was prepared with Dragon/digital dictation along with [employer]. Any transcriptional errors that result from this process are unintentional.   .Adin Hector, M.D., F.A.C.S. Gastrointestinal and Minimally Invasive Surgery [facility] [address] [place], [place] [address] [phone] Main / Paging  12/10/2016 10:37 AM

## 2016-12-10 NOTE — ED Notes (Signed)
Attempted to call report, nurse unavailable, number given for return call.

## 2016-12-10 NOTE — ED Notes (Signed)
[facility] called with bed at [facility] 1506. [phone]

## 2016-12-10 NOTE — ED Triage Notes (Signed)
Constipation and abd pain x 3 days. Seen at [facility] yesterday, had xray and labs, instructed to do liquid diet and return to ED if symptoms worsened. Pt reports pain is worse.

## 2016-12-10 NOTE — H&P (Signed)
History and Physical    Leonard Lucas JTT:[id number] DOB: 01-Mar-1991 DOA: 12/10/2016    PCP: Pcp Not In System  Patient coming from: home  Chief Complaint: abdominal pain, constipation and vomiting  HPI: Leonard Lucas is a 26 y.o. male with medical history of anxiety, depression, PUD 2 yrs ago who has been constipated for 3-4 days now. He has pain in his lower abdomen which is constant and at times crampy. He was seen in the [facility] ER yesterday and asked to take clear liquids and Miralax. After taking the Miralax, he began to vomit and vomited 5 times. No fevers. No h/o constipation or diarrhea. No narcotic use or any new medication use.   ED Course:  Abd Xray yesterday showed constipation and ileus CT scan with contrast show fecalith obstructing the descending colon and possibly adjacent colitis.  Given a dose of IV Morphine with helped with the pain  Review of Systems:  Depression and anxiety is controlled with medications.  All other systems reviewed and apart from HPI, are negative.  Past Medical History:  Diagnosis Date  . Anxiety   . Depression     Past Surgical History:  Procedure Laterality Date  . ESOPHAGOGASTRODUODENOSCOPY N/A 01/21/2015   Procedure: ESOPHAGOGASTRODUODENOSCOPY (EGD);  Surgeon: Lafayette Dragon, MD;  Location: [facility] ENDOSCOPY;  Service: Endoscopy;  Laterality: N/A;  . WISDOM TOOTH EXTRACTION  08/2010    Social History:   reports that he has never smoked. He has never used smokeless tobacco. He reports that he does not drink alcohol or use drugs.  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 Breast cancer Mother   . Depression Mother   . Depression Paternal Aunt      Prior to Admission medications   Medication Sig Start Date End Date Taking? Authorizing Provider  buPROPion (WELLBUTRIN) 100 MG tablet TAKE 3 TABLETS BY MOUTH EVERY DAY 12/07/16   [name], MD  Fluocinolone  Acetonide Scalp (DERMA-SMOOTHE/FS SCALP) 0.01 % OIL Apply to scalp at bedtime; wash off next morning. 10/19/16   Kandra Nicolas, MD    Physical Exam: Wt Readings from Last 3 Encounters:  12/10/16 124.3 kg (274 lb)  12/09/16 123.4 kg (272 lb)  10/19/16 126.1 kg (278 lb)   Vitals:   12/10/16 0843 12/10/16 1101  BP: 134/90 128/89  Pulse: 82 89  Resp: 18 18  Temp: 98.8 F (37.1 C)   TempSrc: Oral   SpO2: 100% 100%  Weight: 124.3 kg (274 lb)   Height: 6' (1.829 m)       Constitutional: NAD, calm, comfortable Eyes: PERTLA, lids and conjunctivae normal ENMT: Mucous membranes are moist. Posterior pharynx clear of any exudate or lesions. Normal dentition.  Neck: normal, supple, no masses, no thyromegaly Respiratory: clear to auscultation bilaterally, no wheezing, no crackles. Normal respiratory effort. No accessory muscle use.  Cardiovascular: S1 & S2 heard, regular rate and rhythm, no murmurs / rubs / gallops. No extremity edema. 2+ pedal pulses. No carotid bruits.  Abdomen: No distension, suprapubic tenderness, no masses palpated. No hepatosplenomegaly. Bowel sounds normal.  Musculoskeletal: no clubbing / cyanosis. No joint deformity upper and lower extremities. Good ROM, no contractures. Normal muscle tone.  Skin: no rashes, lesions, ulcers. No induration Neurologic: CN 2-12 grossly intact. Sensation intact, DTR normal. Strength 5/5 in all 4 limbs.  Psychiatric: Normal judgment and insight. Alert and oriented x 3. Normal  mood.     Labs on Admission: I have personally reviewed following labs and imaging studies  CBC:  Recent Labs Lab 12/10/16 0922  WBC 12.0*  NEUTROABS 10.1*  HGB 15.6  HCT 46.0  MCV 85.2  PLT 789   Basic Metabolic Panel:  Recent Labs Lab 12/09/16 1359 12/10/16 0922  NA 139 136  K 4.3 3.7  CL 100 96*  CO2 29 29  GLUCOSE 102* 117*  BUN 7 8  CREATININE 0.99 1.11  CALCIUM 10.1 9.5   GFR: Estimated Creatinine Clearance: 138.6 mL/min (by C-G  formula based on SCr of 1.11 mg/dL). Liver Function Tests:  Recent Labs Lab 12/09/16 1359 12/10/16 0922  AST 13 17  ALT 16 19  ALKPHOS 100 92  BILITOT 0.6 0.7  PROT 7.5 7.8  ALBUMIN 4.7 4.6    Recent Labs Lab 12/09/16 1359 12/10/16 0922  LIPASE 8 15  AMYLASE 24  --    No results for input(s): AMMONIA in the last 168 hours. Coagulation Profile: No results for input(s): INR, PROTIME in the last 168 hours. Cardiac Enzymes: No results for input(s): CKTOTAL, CKMB, CKMBINDEX, TROPONINI in the last 168 hours. BNP (last 3 results) No results for input(s): PROBNP in the last 8760 hours. HbA1C: No results for input(s): HGBA1C in the last 72 hours. CBG: No results for input(s): GLUCAP in the last 168 hours. Lipid Profile: No results for input(s): CHOL, HDL, LDLCALC, TRIG, CHOLHDL, LDLDIRECT in the last 72 hours. Thyroid Function Tests:  Recent Labs  12/09/16 1359  TSH 0.94   Anemia Panel: No results for input(s): VITAMINB12, FOLATE, FERRITIN, TIBC, IRON, RETICCTPCT in the last 72 hours. Urine analysis:    Component Value Date/Time   COLORURINE YELLOW 01/21/2015 [place] 01/21/2015 1247   LABSPEC 1.023 01/21/2015 1247   PHURINE 6.0 01/21/2015 1247   GLUCOSEU NEGATIVE 01/21/2015 1247   HGBUR NEGATIVE 01/21/2015 1247   BILIRUBINUR negative 12/09/2016 1304   KETONESUR negative 12/09/2016 1304   KETONESUR NEGATIVE 01/21/2015 1247   PROTEINUR negative 12/09/2016 1304   PROTEINUR NEGATIVE 01/21/2015 1247   UROBILINOGEN 0.2 12/09/2016 1304   UROBILINOGEN 0.2 01/21/2015 1247   NITRITE Negative 12/09/2016 1304   NITRITE NEGATIVE 01/21/2015 1247   LEUKOCYTESUR Negative 12/09/2016 1304   Sepsis Labs: @LABRCNTIP (procalcitonin:4,lacticidven:4) )No results found for this or any previous visit (from the past 240 hour(s)).   Radiological Exams on Admission: Dg Abdomen 1 View  Result Date: 12/09/2016 CLINICAL DATA:  Abdominal pain EXAM: ABDOMEN - 1 VIEW  COMPARISON:  None. FINDINGS: Moderate stool in the right colon. Gas distended transverse and left colon. Small bowel nondilated. Probable ileus and constipation. No acute skeletal abnormality.  No urinary tract calculi. IMPRESSION: Constipation and ileus. Electronically Signed   By: Franchot Gallo M.D.   On: 12/09/2016 13:21   Ct Abdomen Pelvis W Contrast  Result Date: 12/10/2016 CLINICAL DATA:  Three days of nausea with vomiting and abdominal pain EXAM: CT ABDOMEN AND PELVIS WITH CONTRAST TECHNIQUE: Multidetector CT imaging of the abdomen and pelvis was performed using the standard protocol following bolus administration of intravenous contrast. CONTRAST:  184mL ISOVUE-300 IOPAMIDOL (ISOVUE-300) INJECTION 61% COMPARISON:  KUB from yesterday FINDINGS: Lower chest:  No contributory findings. Hepatobiliary: No focal liver abnormality.Subtle gas density in the gallbladder consistent with nitrogen within a gallstone. Pancreas: Unremarkable. Spleen: Unremarkable. Adrenals/Urinary Tract: Negative adrenals. No hydronephrosis or stone. Unremarkable bladder. Stomach/Bowel: The colon is dilated and gas-filled to the descending segment where there is desiccated stool  containing at least 3 pills. Proximal to this level the colon wall is focally thickened from submucosal edema, with prominent enhancement. Regional mild fat inflammation. The distal colon is decompressed. No perforation or abscess is noted. No discrete enhancing masslike lesion. Negative appendix. Vascular/Lymphatic: No acute vascular abnormality. No mass or adenopathy. Reproductive:No pathologic findings. Other: Trace pelvic fluid, considered reactive. Musculoskeletal: No acute abnormalities. IMPRESSION: 1. Fecalith obstructing the descending colon. Regional colitis may be obstructive/stercoral colitis, but underlying primary colitis is not excluded. 2. Cholelithiasis. Electronically Signed   By: Monte Fantasia M.D.   On: 12/10/2016 09:53     Assessment/Plan Principal Problem:   Fecal impaction of colon with abdominal pain and vomiting - give soap suds enema x 3 Q 4 hrs - NPO except ice chips and meds - PRN Morphine for severe pain only (cannot give Toradol due to PUD 2 yrs ago) - TSH normal - repeat abd xray tomorrow  Active Problems:  Depression, major, recurrent, moderate - hold Wellbutrin today  BMI  Body mass index is 37.16 kg/m.    DVT prophylaxis: Lovenox  Code Status: Full code  Family Communication:   Disposition Plan: med/surg  Consults called:   Admission status: observation    Debbe Odea MD [facility] Pager: [email or web address] Password [email or web address] 7PM-7AM, please contact night-coverage   12/10/2016, 1:27 PM

## 2016-12-10 NOTE — ED Notes (Signed)
Pt states he attempted to drink miralax this morning but couldn't get it down.

## 2016-12-11 ENCOUNTER — Observation Stay (HOSPITAL_COMMUNITY): Payer: 59

## 2016-12-11 DIAGNOSIS — E669 Obesity, unspecified: Secondary | ICD-10-CM | POA: Diagnosis not present

## 2016-12-11 DIAGNOSIS — Z6837 Body mass index (BMI) 37.0-37.9, adult: Secondary | ICD-10-CM | POA: Diagnosis not present

## 2016-12-11 DIAGNOSIS — F331 Major depressive disorder, recurrent, moderate: Secondary | ICD-10-CM | POA: Diagnosis not present

## 2016-12-11 DIAGNOSIS — F419 Anxiety disorder, unspecified: Secondary | ICD-10-CM | POA: Diagnosis not present

## 2016-12-11 DIAGNOSIS — R1110 Vomiting, unspecified: Secondary | ICD-10-CM | POA: Diagnosis not present

## 2016-12-11 DIAGNOSIS — K567 Ileus, unspecified: Secondary | ICD-10-CM | POA: Diagnosis not present

## 2016-12-11 DIAGNOSIS — D72829 Elevated white blood cell count, unspecified: Secondary | ICD-10-CM | POA: Diagnosis not present

## 2016-12-11 DIAGNOSIS — K5900 Constipation, unspecified: Secondary | ICD-10-CM

## 2016-12-11 DIAGNOSIS — K5649 Other impaction of intestine: Secondary | ICD-10-CM | POA: Diagnosis not present

## 2016-12-11 DIAGNOSIS — F329 Major depressive disorder, single episode, unspecified: Secondary | ICD-10-CM | POA: Diagnosis not present

## 2016-12-11 DIAGNOSIS — K8020 Calculus of gallbladder without cholecystitis without obstruction: Secondary | ICD-10-CM | POA: Diagnosis not present

## 2016-12-11 DIAGNOSIS — K5641 Fecal impaction: Principal | ICD-10-CM

## 2016-12-11 DIAGNOSIS — R1114 Bilious vomiting: Secondary | ICD-10-CM | POA: Diagnosis not present

## 2016-12-11 LAB — BASIC METABOLIC PANEL
ANION GAP: 9 (ref 5–15)
BUN: 10 mg/dL (ref 6–20)
CALCIUM: 9.1 mg/dL (ref 8.9–10.3)
CHLORIDE: 101 mmol/L (ref 101–111)
CO2: 26 mmol/L (ref 22–32)
CREATININE: 0.99 mg/dL (ref 0.61–1.24)
GFR calc Af Amer: >60 mL/min (ref >60)
GFR calc non Af Amer: >60 mL/min (ref >60)
GLUCOSE: 91 mg/dL (ref 65–99)
Potassium: 3.5 mmol/L (ref 3.5–5.1)
Sodium: 136 mmol/L (ref 135–145)

## 2016-12-11 LAB — CBC
HCT: 43.2 % (ref 39.0–52.0)
HEMOGLOBIN: 14.4 g/dL (ref 13.0–17.0)
MCH: 28.8 pg (ref 26.0–34.0)
MCHC: 33.3 g/dL (ref 30.0–36.0)
MCV: 86.4 fL (ref 78.0–100.0)
Platelets: 351 10*3/uL (ref 150–400)
RBC: 5 MIL/uL (ref 4.22–5.81)
RDW: 13 % (ref 11.5–15.5)
WBC: 10.6 10*3/uL — ABNORMAL HIGH (ref 4.0–10.5)

## 2016-12-11 LAB — HIV ANTIBODY (ROUTINE TESTING W REFLEX): HIV Screen 4th Generation wRfx: NONREACTIVE

## 2016-12-11 MED ORDER — SORBITOL 70 % SOLN
960.0000 mL | TOPICAL_OIL | Freq: Once | ORAL | Status: AC
  Administered 2016-12-11: 960 mL via RECTAL
  Filled 2016-12-11: qty 240

## 2016-12-11 NOTE — Discharge Summary (Signed)
Physician Discharge Summary  Leonard Lucas:[id number] DOB: March 14, 1991 DOA: 12/10/2016  PCP: Pcp Not In System  Admit date: 12/10/2016 Discharge date: 12/11/2016  Time spent: 45 minutes  Recommendations for Outpatient Follow-up:  Patient will be discharged to home.  Patient will need to follow up with primary care provider within one week of discharge. Patient should continue medications as prescribed.  Patient should follow a regular diet.   Discharge Diagnoses:  Abdominal pain with vomiting secondary to Fecal impaction Leukocytosis Cholelithiasis Depression Obesity  Discharge Condition: Stable  Diet recommendation: regular  Filed Weights   12/10/16 0843  Weight: 124.3 kg (274 lb)    History of present illness:  on 12/10/2016 by Leonard Lucas a 26 y.o.malewith medical history of anxiety, depression, PUD 2 yrs ago who has been constipated for 3-4 days now. He has pain in his lower abdomen which is constant and at times crampy. He was seen in the [facility] ER yesterday and asked to take clear liquids and Miralax. After taking the Miralax, he began to vomit and vomited 5 times. No fevers. No h/o constipation or diarrhea. No narcotic use or any new medication use.   Hospital Course:  Abdominal pain with vomiting secondary to Fecal impaction -abdominal Xray showed constipation and ileus -CT abd pelvis: Fecalith obstructing the descending colon. Regional colitis may be obstructive/stercoral colitis -Was placed on soap suds enema with little results -Gastroenterology consulted and appreciated, recommended SMOG enema  -Repeat Abd XRay showed nonobstructive gas bowel pattern -patient able to have bowel movement after SMOG enema  Leukocytosis -likely reactive  -WBC trending downward  Cholelithiasis -Noted on CT scan  Depression -Wellbutrin held  Obesity -BMI 37.16 -patient will need to discuss lifestyle modifications with PCP upon  discharge  Consultants Gastroenterology  Procedures  None  Discharge Exam: Vitals:   12/11/16 0533 12/11/16 1419  BP: (!) 114/57 111/68  Pulse: 75 63  Resp: 16 18  Temp: 98.2 F (36.8 C) 98.9 F (37.2 C)   Patient feeling better. States "not really" to all questions. Feels abdominal pain is better. Able to have bowel movements. No complaints of pain, chest pain, shortness of breath. No complaints of further nausea or vomiting.   Exam  General: Well developed, well nourished, no apparent distress  HEENT: NCAT, mucous membranes moist.   Cardiovascular: S1 S2 auscultated, RRR, no murmurs  Respiratory: Clear to auscultation bilaterally   Abdomen: Soft, nontender, nondistended, + bowel sounds  Extremities: warm dry without cyanosis clubbing or edema  Neuro: AAOx3, nonfocal  Psych: Appropriate  Discharge Instructions Discharge Instructions    Discharge instructions    Complete by:  As directed    Patient will be discharged to home.  Patient will need to follow up with primary care provider within one week of discharge. Patient should continue medications as prescribed.  Patient should follow a regular diet.     Current Discharge Medication List    CONTINUE these medications which have NOT CHANGED   Details  buPROPion (WELLBUTRIN) 100 MG tablet TAKE 3 TABLETS BY MOUTH EVERY DAY Qty: 90 tablet, Refills: 0    omeprazole (PRILOSEC) 20 MG capsule Take 20 mg by mouth daily as needed (for heartburn/indigestion).       No Known Allergies Follow-up Information    [facility] EM[facility]PART[facility] Follow up.   Specialty:  Emergency Medicine Why:  If symptoms worsen Contact information: [place] [phone] [place]  Primary care physician. Schedule an appointment as soon as possible for a visit in 1 week(s).   Why:  Hospital follow up           The results of significant diagnostics from  this hospitalization (including imaging, microbiology, ancillary and laboratory) are listed below for reference.    Significant Diagnostic Studies: Dg Abd 1 View  Result Date: 12/11/2016 CLINICAL DATA:  26 year old male with history of constipation and abdominal cramping. EXAM: ABDOMEN - 1 VIEW COMPARISON:  Abdominal radiograph 12/09/2016. FINDINGS: Gas and stool are seen scattered throughout the colon extending to the level of the distal rectum. No pathologic distension of small bowel is noted. No gross evidence of pneumoperitoneum. IMPRESSION: 1. Nonobstructive bowel gas pattern. 2. No pneumoperitoneum. Electronically Signed   By: [name] M.D.   On: 12/11/2016 13:35   Dg Abdomen 1 View  Result Date: 12/09/2016 CLINICAL DATA:  Abdominal pain EXAM: ABDOMEN - 1 VIEW COMPARISON:  None. FINDINGS: Moderate stool in the right colon. Gas distended transverse and left colon. Small bowel nondilated. Probable ileus and constipation. No acute skeletal abnormality.  No urinary tract calculi. IMPRESSION: Constipation and ileus. Electronically Signed   By: Franchot Gallo M.D.   On: 12/09/2016 13:21   Ct Abdomen Pelvis W Contrast  Result Date: 12/10/2016 CLINICAL DATA:  Three days of nausea with vomiting and abdominal pain EXAM: CT ABDOMEN AND PELVIS WITH CONTRAST TECHNIQUE: Multidetector CT imaging of the abdomen and pelvis was performed using the standard protocol following bolus administration of intravenous contrast. CONTRAST:  131mL ISOVUE-300 IOPAMIDOL (ISOVUE-300) INJECTION 61% COMPARISON:  KUB from yesterday FINDINGS: Lower chest:  No contributory findings. Hepatobiliary: No focal liver abnormality.Subtle gas density in the gallbladder consistent with nitrogen within a gallstone. Pancreas: Unremarkable. Spleen: Unremarkable. Adrenals/Urinary Tract: Negative adrenals. No hydronephrosis or stone. Unremarkable bladder. Stomach/Bowel: The colon is dilated and gas-filled to the descending segment where  there is desiccated stool containing at least 3 pills. Proximal to this level the colon wall is focally thickened from submucosal edema, with prominent enhancement. Regional mild fat inflammation. The distal colon is decompressed. No perforation or abscess is noted. No discrete enhancing masslike lesion. Negative appendix. Vascular/Lymphatic: No acute vascular abnormality. No mass or adenopathy. Reproductive:No pathologic findings. Other: Trace pelvic fluid, considered reactive. Musculoskeletal: No acute abnormalities. IMPRESSION: 1. Fecalith obstructing the descending colon. Regional colitis may be obstructive/stercoral colitis, but underlying primary colitis is not excluded. 2. Cholelithiasis. Electronically Signed   By: Monte Fantasia M.D.   On: 12/10/2016 09:53    Microbiology: No results found for this or any previous visit (from the past 240 hour(s)).   Labs: Basic Metabolic Panel:  Recent Labs Lab 12/09/16 1359 12/10/16 0922 12/10/16 1548 12/11/16 0611  NA 139 136  --  136  K 4.3 3.7  --  3.5  CL 100 96*  --  101  CO2 29 29  --  26  GLUCOSE 102* 117*  --  91  BUN 7 8  --  10  CREATININE 0.99 1.11 1.06 0.99  CALCIUM 10.1 9.5  --  9.1   Liver Function Tests:  Recent Labs Lab 12/09/16 1359 12/10/16 0922  AST 13 17  ALT 16 19  ALKPHOS 100 92  BILITOT 0.6 0.7  PROT 7.5 7.8  ALBUMIN 4.7 4.6    Recent Labs Lab 12/09/16 1359 12/10/16 0922  LIPASE 8 15  AMYLASE 24  --    No results for input(s): AMMONIA in the last 168 hours. CBC:  Recent Labs Lab 12/10/16 0922 12/10/16 1548 12/11/16 0611  WBC 12.0* 12.1* 10.6*  NEUTROABS 10.1*  --   --   HGB 15.6 14.4 14.4  HCT 46.0 43.1 43.2  MCV 85.2 84.2 86.4  PLT 388 360 351   Cardiac Enzymes: No results for input(s): CKTOTAL, CKMB, CKMBINDEX, TROPONINI in the last 168 hours. BNP: BNP (last 3 results) No results for input(s): BNP in the last 8760 hours.  ProBNP (last 3 results) No results for input(s): PROBNP in  the last 8760 hours.  CBG: No results for input(s): GLUCAP in the last 168 hours.     SignedCristal Ford  [facility] 12/11/2016, 4:53 PM

## 2016-12-11 NOTE — Discharge Instructions (Signed)
Constipation, Adult °Constipation is when a person: °· Poops (has a bowel movement) fewer times in a week than normal. °· Has a hard time pooping. °· Has poop that is dry, hard, or bigger than normal. ° °Follow these instructions at home: °Eating and drinking ° °· Eat foods that have a lot of fiber, such as: °? Fresh fruits and vegetables. °? Whole grains. °? Beans. °· Eat less of foods that are high in fat, low in fiber, or overly processed, such as: °? French fries. °? Hamburgers. °? Cookies. °? Candy. °? Soda. °· Drink enough fluid to keep your pee (urine) clear or pale yellow. °General instructions °· Exercise regularly or as told by your doctor. °· Go to the restroom when you feel like you need to poop. Do not hold it in. °· Take over-the-counter and prescription medicines only as told by your doctor. These include any fiber supplements. °· Do pelvic floor retraining exercises, such as: °? Doing deep breathing while relaxing your lower belly (abdomen). °? Relaxing your pelvic floor while pooping. °· Watch your condition for any changes. °· Keep all follow-up visits as told by your doctor. This is important. °Contact a doctor if: °· You have pain that gets worse. °· You have a fever. °· You have not pooped for 4 days. °· You throw up (vomit). °· You are not hungry. °· You lose weight. °· You are bleeding from the anus. °· You have thin, pencil-like poop (stool). °Get help right away if: °· You have a fever, and your symptoms suddenly get worse. °· You leak poop or have blood in your poop. °· Your belly feels hard or bigger than normal (is bloated). °· You have very bad belly pain. °· You feel dizzy or you faint. °This information is not intended to replace advice given to you by your health care provider. Make sure you discuss any questions you have with your health care provider. °Document Released: 01/26/2008 Document Revised: 02/27/2016 Document Reviewed: 01/28/2016 °Elsevier Interactive Patient Education ©  2017 [employer] Inc[employer] ° °

## 2016-12-11 NOTE — Progress Notes (Signed)
PROGRESS NOTE    Leonard Lucas  LGX:[id number] DOB: 30-Aug-1990 DOA: 12/10/2016 PCP: Pcp Not In System   Chief Complaint  Patient presents with  . Constipation    Brief Narrative:  HPI on 12/10/2016 by Pearline Cables is a 26 y.o. male with medical history of anxiety, depression, PUD 2 yrs ago who has been constipated for 3-4 days now. He has pain in his lower abdomen which is constant and at times crampy. He was seen in the [facility] ER yesterday and asked to take clear liquids and Miralax. After taking the Miralax, he began to vomit and vomited 5 times. No fevers. No h/o constipation or diarrhea. No narcotic use or any new medication use.  Assessment & Plan   Abdominal pain with vomiting secondary to Fecal impaction -abdomina Xray showed constipation and ileus -CT abd pelvis: Fecalith obstructing the descending colon. Regional colitis may be obstructive/stercoral colitis -Received a soapsuds enema x 3, with 2 small BM (per patient) -Gastroenterology consulted and appreciated, recommended SMOG enema  -Will place patient on clear liquid diet as he is no longer complaining of abdominal pain or vomiting   Leukocytosis -likely reactive  -WBC trending downward -continue to monitor CBC  Cholelithiasis -Noted on CT scan  Depression -Wellbutrin held  Obesity -BMI 37.16 -patient will need to discuss lifestyle modifications with PCP upon discharge  DVT Prophylaxis  Lovenox  Code Status: Full  Family Communication: None at bedside  Disposition Plan: Currently in observation. Pending GI recommendations and improvement in constipation  Consultants Gastroenterology  Procedures  None  Antibiotics   Anti-infectives    None      Subjective:   Leonard Lucas seen and examined today.  Patient feels abdominal pain has improved and states he no longer has vomiting. Would like to try liquids. He endorses 2 small bowel movements yesterday. Patient has not had a normal  bowel movement since Tuesday.  Currently denies chest pain, shortness of breath, dizziness, headache.  Objective:   Vitals:   12/10/16 1101 12/10/16 1451 12/10/16 2125 12/11/16 0533  BP: 128/89 136/75 106/67 (!) 114/57  Pulse: 89 80 72 75  Resp: 18 20 20 16   Temp:  98.5 F (36.9 C) 98.6 F (37 C) 98.2 F (36.8 C)  TempSrc:  Oral Oral Oral  SpO2: 100% 100% 99% 97%  Weight:      Height:        Intake/Output Summary (Last 24 hours) at 12/11/16 1254 Last data filed at 12/11/16 0535  Gross per 24 hour  Intake                0 ml  Output                0 ml  Net                0 ml   Filed Weights   12/10/16 0843  Weight: 124.3 kg (274 lb)    Exam  General: Well developed, well nourished, NAD, appears stated age  HEENT: NCAT, mucous membranes moist.   Cardiovascular: S1 S2 auscultated, no rubs, murmurs or gallops. Regular rate and rhythm.  Respiratory: Clear to auscultation bilaterally with equal chest rise  Abdomen: Soft, mild suprapubic TTP, nondistended, + bowel sounds  Extremities: warm dry without cyanosis clubbing or edema  Neuro: AAOx3, nonfocal  Psych: Normal affect and demeanor with intact judgement and insight   Data Reviewed: I have personally reviewed following labs and imaging studies  CBC:  Recent Labs Lab 12/10/16 0922 12/10/16 1548 12/11/16 0611  WBC 12.0* 12.1* 10.6*  NEUTROABS 10.1*  --   --   HGB 15.6 14.4 14.4  HCT 46.0 43.1 43.2  MCV 85.2 84.2 86.4  PLT 388 360 767   Basic Metabolic Panel:  Recent Labs Lab 12/09/16 1359 12/10/16 0922 12/10/16 1548 12/11/16 0611  NA 139 136  --  136  K 4.3 3.7  --  3.5  CL 100 96*  --  101  CO2 29 29  --  26  GLUCOSE 102* 117*  --  91  BUN 7 8  --  10  CREATININE 0.99 1.11 1.06 0.99  CALCIUM 10.1 9.5  --  9.1   GFR: Estimated Creatinine Clearance: 155.4 mL/min (by C-G formula based on SCr of 0.99 mg/dL). Liver Function Tests:  Recent Labs Lab 12/09/16 1359 12/10/16 0922  AST 13  17  ALT 16 19  ALKPHOS 100 92  BILITOT 0.6 0.7  PROT 7.5 7.8  ALBUMIN 4.7 4.6    Recent Labs Lab 12/09/16 1359 12/10/16 0922  LIPASE 8 15  AMYLASE 24  --    No results for input(s): AMMONIA in the last 168 hours. Coagulation Profile: No results for input(s): INR, PROTIME in the last 168 hours. Cardiac Enzymes: No results for input(s): CKTOTAL, CKMB, CKMBINDEX, TROPONINI in the last 168 hours. BNP (last 3 results) No results for input(s): PROBNP in the last 8760 hours. HbA1C: No results for input(s): HGBA1C in the last 72 hours. CBG: No results for input(s): GLUCAP in the last 168 hours. Lipid Profile: No results for input(s): CHOL, HDL, LDLCALC, TRIG, CHOLHDL, LDLDIRECT in the last 72 hours. Thyroid Function Tests:  Recent Labs  12/09/16 1359  TSH 0.94   Anemia Panel: No results for input(s): VITAMINB12, FOLATE, FERRITIN, TIBC, IRON, RETICCTPCT in the last 72 hours. Urine analysis:    Component Value Date/Time   COLORURINE YELLOW 01/21/2015 [place] 01/21/2015 1247   LABSPEC 1.023 01/21/2015 1247   PHURINE 6.0 01/21/2015 1247   GLUCOSEU NEGATIVE 01/21/2015 1247   HGBUR NEGATIVE 01/21/2015 1247   BILIRUBINUR negative 12/09/2016 1304   KETONESUR negative 12/09/2016 1304   KETONESUR NEGATIVE 01/21/2015 1247   PROTEINUR negative 12/09/2016 1304   PROTEINUR NEGATIVE 01/21/2015 1247   UROBILINOGEN 0.2 12/09/2016 1304   UROBILINOGEN 0.2 01/21/2015 1247   NITRITE Negative 12/09/2016 1304   NITRITE NEGATIVE 01/21/2015 1247   LEUKOCYTESUR Negative 12/09/2016 1304   Sepsis Labs: @LABRCNTIP (procalcitonin:4,lacticidven:4)  )No results found for this or any previous visit (from the past 240 hour(s)).    Radiology Studies: Dg Abdomen 1 View  Result Date: 12/09/2016 CLINICAL DATA:  Abdominal pain EXAM: ABDOMEN - 1 VIEW COMPARISON:  None. FINDINGS: Moderate stool in the right colon. Gas distended transverse and left colon. Small bowel nondilated.  Probable ileus and constipation. No acute skeletal abnormality.  No urinary tract calculi. IMPRESSION: Constipation and ileus. Electronically Signed   By: Franchot Gallo M.D.   On: 12/09/2016 13:21   Ct Abdomen Pelvis W Contrast  Result Date: 12/10/2016 CLINICAL DATA:  Three days of nausea with vomiting and abdominal pain EXAM: CT ABDOMEN AND PELVIS WITH CONTRAST TECHNIQUE: Multidetector CT imaging of the abdomen and pelvis was performed using the standard protocol following bolus administration of intravenous contrast. CONTRAST:  177mL ISOVUE-300 IOPAMIDOL (ISOVUE-300) INJECTION 61% COMPARISON:  KUB from yesterday FINDINGS: Lower chest:  No contributory findings. Hepatobiliary: No focal liver abnormality.Subtle gas density in the gallbladder consistent with  nitrogen within a gallstone. Pancreas: Unremarkable. Spleen: Unremarkable. Adrenals/Urinary Tract: Negative adrenals. No hydronephrosis or stone. Unremarkable bladder. Stomach/Bowel: The colon is dilated and gas-filled to the descending segment where there is desiccated stool containing at least 3 pills. Proximal to this level the colon wall is focally thickened from submucosal edema, with prominent enhancement. Regional mild fat inflammation. The distal colon is decompressed. No perforation or abscess is noted. No discrete enhancing masslike lesion. Negative appendix. Vascular/Lymphatic: No acute vascular abnormality. No mass or adenopathy. Reproductive:No pathologic findings. Other: Trace pelvic fluid, considered reactive. Musculoskeletal: No acute abnormalities. IMPRESSION: 1. Fecalith obstructing the descending colon. Regional colitis may be obstructive/stercoral colitis, but underlying primary colitis is not excluded. 2. Cholelithiasis. Electronically Signed   By: Monte Fantasia M.D.   On: 12/10/2016 09:53     Scheduled Meds: . enoxaparin (LOVENOX) injection  60 mg Subcutaneous Q24H  . sorbitol, milk of mag, mineral oil, glycerin (SMOG) enema   960 mL Rectal Once   Continuous Infusions:   LOS: 0 days   Time Spent in minutes   30 minutes  Carriann Hesse D.O. on 12/11/2016 at 12:54 PM  Between 7am to 7pm - Pager - [phone]  After 7pm go to [email or web address] - password [email or web address]  And look for the night coverage person covering for me after hours  [facility] Office  [phone]

## 2016-12-11 NOTE — Progress Notes (Signed)
Pt discharged to home. Dc instructions given. No concerns voiced. Work note given. Left unit ambulatorily accompanied by male family member . Left in good condition. VWilliams,rn.

## 2016-12-11 NOTE — Progress Notes (Signed)
1000 cc of soap suds enema administered. Pt was able to hold in fluids. Pt reported mostly liquid with small chunks of brown stool returned.

## 2016-12-11 NOTE — Consult Note (Signed)
[facility] Consult Note   History Leonard Lucas MRN # [id number]  Date of Admission: 12/10/2016 Date of Consultation: 12/11/2016 Referring physician: Dr. Cristal Ford, DO  Reason for Consultation/Chief Complaint: Abdominal pain and constipation  Subjective  HPI:  This is a 26 year old man admitted last night for worsening abdominal pain with vomiting and severe constipation. We will call for consultation this morning, I was only just available to evaluate him about 30 minutes ago. We were also asked to evaluate his CT scan for concern of a possible obstruction.  At that point, he had had several enemas and recently finished a SMOG enema. He reports having had considerable output of formed and loose stool and has no further abdominal pain.  He feels much better and would like to be discharged home.  He is not had anything like this happen do in the past to his recollection.   ROS:  Depression  All other systems are negative except as noted above in the HPI  Past Medical History Past Medical History:  Diagnosis Date  . Anxiety   . Depression     Past Surgical History Past Surgical History:  Procedure Laterality Date  . ESOPHAGOGASTRODUODENOSCOPY N/A 01/21/2015   Procedure: ESOPHAGOGASTRODUODENOSCOPY (EGD);  Surgeon: Lafayette Dragon, MD;  Location: [facility] ENDOSCOPY;  Service: Endoscopy;  Laterality: N/A;  . WISDOM TOOTH EXTRACTION  08/2010    Family History Family History  Problem Relation Age of Onset  . Breast cancer Mother   . Depression Mother   . Depression Paternal Aunt     Social History Social History   Social History  . Marital status: Single    Spouse name: N/A  . Number of children: N/A  . Years of education: N/A   Social History Main Topics  . Smoking status: Never Smoker  . Smokeless tobacco: Never Used  . Alcohol use No  . Drug use: No  . Sexual activity: No   Other Topics Concern  . None   Social History Narrative  . None     Allergies No Known Allergies  Outpatient Meds Home medications from the H+P and/or nursing med reconciliation reviewed.  Inpatient med list reviewed  _____________________________________________________________________ Objective   Exam:  Current vital signs  Patient Vitals for the past 8 hrs:  BP Temp Temp src Pulse Resp SpO2  12/11/16 1419 111/68 98.9 F (37.2 C) Oral 63 18 99 %    Intake/Output Summary (Last 24 hours) at 12/11/16 1659 Last data filed at 12/11/16 0535  Gross per 24 hour  Intake                0 ml  Output                0 ml  Net                0 ml    Physical Exam:    General: this is a well-appearing young male patient in no acute distress  Eyes: sclera anicteric, no redness  ENT: oral mucosa moist without lesions, no cervical or supraclavicular lymphadenopathy, good dentition  CV: RRR without murmur, S1/S2, no JVD,, no peripheral edema  Resp: clear to auscultation bilaterally, normal RR and effort noted  GI: soft, nondistended, no tenderness, with active bowel sounds. No guarding or palpable organomegaly noted  Skin; warm and dry, no rash or jaundice noted  Neuro: awake, alert and oriented x 3. Normal gross motor function and fluent speech.  Labs:   Recent  Labs Lab 12/10/16 0922 12/10/16 1548 12/11/16 0611  WBC 12.0* 12.1* 10.6*  HGB 15.6 14.4 14.4  HCT 46.0 43.1 43.2  PLT 388 360 351    Recent Labs Lab 12/10/16 0922 12/11/16 0611  NA 136 136  K 3.7 3.5  CL 96* 101  CO2 29 26  BUN 8 10  ALBUMIN 4.6  --   ALKPHOS 92  --   ALT 19  --   AST 17  --   GLUCOSE 117* 91   No results for input(s): INR in the last 168 hours.  Radiologic studies:  I personally reviewed the images of the CT scan of the abdomen and pelvis. There was inspissated stool in the descending colon with some radiodensity that may be pill fragments.  There is some mild reactive edema at this area, but nothing that looks to me like a mass or  chronic inflammatory process such as Crohn's disease.  He also has no chronic abdominal pain, diarrhea, rectal bleeding or obstructive sounding symptoms.   @ASSESSMENTPLANBEGIN @ Impression:  Acute constipation, now resolved.   Plan:  I believe he can safely go home today. I asked him to take MiraLAX twice a day for the next week and he can feel free to see [place] in the clinic as needed.    Thank you for the courtesy of this consult.  Please contact me with any questions or concerns.  Nelida Meuse III Pager: [phone] Mon-Fri 8a-5p [phone] after 5p, weekends, holidays

## 2016-12-15 ENCOUNTER — Encounter (HOSPITAL_COMMUNITY): Payer: Self-pay | Admitting: Psychiatry

## 2016-12-15 ENCOUNTER — Ambulatory Visit (INDEPENDENT_AMBULATORY_CARE_PROVIDER_SITE_OTHER): Payer: 59 | Admitting: Psychiatry

## 2016-12-15 VITALS — BP 120/80 | HR 69 | Resp 16 | Ht 72.0 in | Wt 271.0 lb

## 2016-12-15 DIAGNOSIS — F411 Generalized anxiety disorder: Secondary | ICD-10-CM

## 2016-12-15 DIAGNOSIS — Z79899 Other long term (current) drug therapy: Secondary | ICD-10-CM

## 2016-12-15 DIAGNOSIS — Z818 Family history of other mental and behavioral disorders: Secondary | ICD-10-CM | POA: Diagnosis not present

## 2016-12-15 DIAGNOSIS — F4010 Social phobia, unspecified: Secondary | ICD-10-CM | POA: Diagnosis not present

## 2016-12-15 DIAGNOSIS — F331 Major depressive disorder, recurrent, moderate: Secondary | ICD-10-CM | POA: Diagnosis not present

## 2016-12-15 MED ORDER — BUPROPION HCL 100 MG PO TABS: 300.0000 mg | ORAL_TABLET | Freq: Every day | ORAL | 2 refills | Status: DC

## 2016-12-15 MED FILL — buPROPion HCL 100 MG TABS: 100 | 30 days supply | Qty: 90 | Fill #0

## 2016-12-15 NOTE — Progress Notes (Signed)
Patient ID: Leonard Lucas, male   DOB: Jun 14, 1991, 26 y.o.   MRN: [id number] [facility] Follow-up Outpatient Visit  Leonard Lucas [id number] 26 y.o.  12/15/2016  Chief Complaint: depression follow up   History of Present Illness:    Leonard Lucas is a 26 y/o male with a past psychiatric history significant for symptoms of depression and anxiety. The patient returns for psychiatric services and for medication management.    Patient is doing reasonable depression is not worse because he gave himself busy with his work and videogames. Overall improvement with decrease sleep issues and motivation has been increased. Her anxiety is baseline not excessive  Severity of depression: 8/10. 10 being no depression.    Past Medical History:  Diagnosis Date  . Anxiety   . Depression    Family History  Problem Relation Age of Onset  . Breast cancer Mother   . Depression Mother   . Depression Paternal Aunt     Outpatient Encounter Prescriptions as of 12/15/2016  Medication Sig  . buPROPion (WELLBUTRIN) 100 MG tablet Take 3 tablets (300 mg total) by mouth daily.  Marland Kitchen omeprazole (PRILOSEC) 20 MG capsule Take 20 mg by mouth daily as needed (for heartburn/indigestion).  . [DISCONTINUED] buPROPion (WELLBUTRIN) 100 MG tablet TAKE 3 TABLETS BY MOUTH EVERY DAY   No facility-administered encounter medications on file as of 12/15/2016.     Recent Results (from the past 2160 hour(s))  POCT CBC w auto diff     Status: None   Collection Time: 12/09/16 12:56 PM  Result Value Ref Range   WBC  4.5 - 10.5 K/uL    Comment: see scanned report   Lymphocytes relative %  15 - 45 %   Monocytes relative %  2 - 10 %   Neutrophils relative % (GR)  44 - 76 %   Lymphocytes absolute  0.1 - 1.8 K/uL   Monocyes absolute  0.1 - 1 K/uL   Neutrophils absolute (GR#)  1.7 - 7.7 K/uL   RBC  4.2 - 5.8 MIL/uL   Hemoglobin  13 - 17 g/dL   Hematocrit  38.5 - 51 %   MCV  80 - 98 fL   MCH  26.5 - 32.5 pg   MCHC   32.5 - 36.9 g/dL   RDW  11.6 - 14 %   Platelet count  140 - 400 K/uL   MPV  7.8 - 11 fL  POCT urinalysis dipstick     Status: None   Collection Time: 12/09/16  1:04 PM  Result Value Ref Range   Color, UA yellow yellow   Clarity, UA clear clear   Glucose, UA negative negative mg/dL   Bilirubin, UA negative negative   Ketones, POC UA negative negative mg/dL   Spec Grav, UA 1.015 1.010 - 1.025   Blood, UA negative negative   pH, UA 7.0 5.0 - 8.0   Protein Ur, POC negative negative mg/dL   Urobilinogen, UA 0.2 0.2 or 1.0 E.U./dL   Nitrite, UA Negative Negative   Leukocytes, UA Negative Negative  COMPLETE METABOLIC PANEL WITH GFR     Status: Abnormal   Collection Time: 12/09/16  1:59 PM  Result Value Ref Range   Sodium 139 135 - 146 mmol/L   Potassium 4.3 3.5 - 5.3 mmol/L   Chloride 100 98 - 110 mmol/L   CO2 29 20 - 31 mmol/L   Glucose, Bld 102 (H) 65 - 99 mg/dL   BUN 7 7 -  25 mg/dL   Creat 0.99 0.60 - 1.35 mg/dL   Total Bilirubin 0.6 0.2 - 1.2 mg/dL   Alkaline Phosphatase 100 40 - 115 U/L   AST 13 10 - 40 U/L   ALT 16 9 - 46 U/L   Total Protein 7.5 6.1 - 8.1 g/dL   Albumin 4.7 3.6 - 5.1 g/dL   Calcium 10.1 8.6 - 10.3 mg/dL   GFR, Est African American >89 >=60 mL/min   GFR, Est Non African American >89 >=60 mL/min  Amylase     Status: None   Collection Time: 12/09/16  1:59 PM  Result Value Ref Range   Amylase 24 0 - 105 U/L  Lipase     Status: None   Collection Time: 12/09/16  1:59 PM  Result Value Ref Range   Lipase 8 7 - 60 U/L  TSH     Status: None   Collection Time: 12/09/16  1:59 PM  Result Value Ref Range   TSH 0.94 0.40 - 4.50 mIU/L  CBC with Differential     Status: Abnormal   Collection Time: 12/10/16  9:22 AM  Result Value Ref Range   WBC 12.0 (H) 4.0 - 10.5 K/uL   RBC 5.40 4.22 - 5.81 MIL/uL   Hemoglobin 15.6 13.0 - 17.0 g/dL   HCT 46.0 39.0 - 52.0 %   MCV 85.2 78.0 - 100.0 fL   MCH 28.9 26.0 - 34.0 pg   MCHC 33.9 30.0 - 36.0 g/dL   RDW 13.0 11.5 -  15.5 %   Platelets 388 150 - 400 K/uL   Neutrophils Relative % 84 %   Neutro Abs 10.1 (H) 1.7 - 7.7 K/uL   Lymphocytes Relative 9 %   Lymphs Abs 1.1 0.7 - 4.0 K/uL   Monocytes Relative 7 %   Monocytes Absolute 0.8 0.1 - 1.0 K/uL   Eosinophils Relative 0 %   Eosinophils Absolute 0.0 0.0 - 0.7 K/uL   Basophils Relative 0 %   Basophils Absolute 0.0 0.0 - 0.1 K/uL  Comprehensive metabolic panel     Status: Abnormal   Collection Time: 12/10/16  9:22 AM  Result Value Ref Range   Sodium 136 135 - 145 mmol/L   Potassium 3.7 3.5 - 5.1 mmol/L   Chloride 96 (L) 101 - 111 mmol/L   CO2 29 22 - 32 mmol/L   Glucose, Bld 117 (H) 65 - 99 mg/dL   BUN 8 6 - 20 mg/dL   Creatinine, Ser 1.11 0.61 - 1.24 mg/dL   Calcium 9.5 8.9 - 10.3 mg/dL   Total Protein 7.8 6.5 - 8.1 g/dL   Albumin 4.6 3.5 - 5.0 g/dL   AST 17 15 - 41 U/L   ALT 19 17 - 63 U/L   Alkaline Phosphatase 92 38 - 126 U/L   Total Bilirubin 0.7 0.3 - 1.2 mg/dL   GFR calc non Af Amer >60 >60 mL/min   GFR calc Af Amer >60 >60 mL/min    Comment: (NOTE) The eGFR has been calculated using the CKD EPI equation. This calculation has not been validated in all clinical situations. eGFR's persistently <60 mL/min signify possible Chronic Kidney Disease.    Anion gap 11 5 - 15  Lipase, blood     Status: None   Collection Time: 12/10/16  9:22 AM  Result Value Ref Range   Lipase 15 11 - 51 U/L  HIV antibody (Routine Testing)     Status: None   Collection Time: 12/10/16  3:48 PM  Result Value Ref Range   HIV Screen 4th Generation wRfx Non Reactive Non Reactive    Comment: (NOTE) Performed At: [facility] [place], [place] [id number] Lindon Romp MD [id number]   CBC     Status: Abnormal   Collection Time: 12/10/16  3:48 PM  Result Value Ref Range   WBC 12.1 (H) 4.0 - 10.5 K/uL   RBC 5.12 4.22 - 5.81 MIL/uL   Hemoglobin 14.4 13.0 - 17.0 g/dL   HCT 43.1 39.0 - 52.0 %   MCV 84.2 78.0 - 100.0 fL   MCH 28.1 26.0  - 34.0 pg   MCHC 33.4 30.0 - 36.0 g/dL   RDW 12.8 11.5 - 15.5 %   Platelets 360 150 - 400 K/uL  Creatinine, serum     Status: None   Collection Time: 12/10/16  3:48 PM  Result Value Ref Range   Creatinine, Ser 1.06 0.61 - 1.24 mg/dL   GFR calc non Af Amer >60 >60 mL/min   GFR calc Af Amer >60 >60 mL/min    Comment: (NOTE) The eGFR has been calculated using the CKD EPI equation. This calculation has not been validated in all clinical situations. eGFR's persistently <60 mL/min signify possible Chronic Kidney Disease.   Basic metabolic panel     Status: None   Collection Time: 12/11/16  6:11 AM  Result Value Ref Range   Sodium 136 135 - 145 mmol/L   Potassium 3.5 3.5 - 5.1 mmol/L   Chloride 101 101 - 111 mmol/L   CO2 26 22 - 32 mmol/L   Glucose, Bld 91 65 - 99 mg/dL   BUN 10 6 - 20 mg/dL   Creatinine, Ser 0.99 0.61 - 1.24 mg/dL   Calcium 9.1 8.9 - 10.3 mg/dL   GFR calc non Af Amer >60 >60 mL/min   GFR calc Af Amer >60 >60 mL/min    Comment: (NOTE) The eGFR has been calculated using the CKD EPI equation. This calculation has not been validated in all clinical situations. eGFR's persistently <60 mL/min signify possible Chronic Kidney Disease.    Anion gap 9 5 - 15  CBC     Status: Abnormal   Collection Time: 12/11/16  6:11 AM  Result Value Ref Range   WBC 10.6 (H) 4.0 - 10.5 K/uL   RBC 5.00 4.22 - 5.81 MIL/uL   Hemoglobin 14.4 13.0 - 17.0 g/dL   HCT 43.2 39.0 - 52.0 %   MCV 86.4 78.0 - 100.0 fL   MCH 28.8 26.0 - 34.0 pg   MCHC 33.3 30.0 - 36.0 g/dL   RDW 13.0 11.5 - 15.5 %   Platelets 351 150 - 400 K/uL    BP 120/80 (BP Location: Right Arm, Patient Position: Sitting, Cuff Size: Normal)   Pulse 69   Resp 16   Ht 6' (1.829 m)   Wt 271 lb (122.9 kg)   SpO2 96%   BMI 36.75 kg/m    Review of Systems  Constitutional: Negative for fever.  Cardiovascular: Negative for chest pain.  Gastrointestinal: Negative for nausea.  Skin: Negative for rash.  Neurological:  Negative for tingling.  Psychiatric/Behavioral: Negative for substance abuse and suicidal ideas.    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 Generalized anxiety disorder  Treatment Plan:  MDD: fair continue wellbutrin GAd: not worsened   Renewed wellbutrin, questions were addressed. FU 3 months  De Nurse, Soriah Leeman, MD

## 2017-01-07 ENCOUNTER — Encounter: Payer: Self-pay | Admitting: Physician Assistant

## 2017-01-07 ENCOUNTER — Ambulatory Visit (INDEPENDENT_AMBULATORY_CARE_PROVIDER_SITE_OTHER): Payer: 59 | Admitting: Physician Assistant

## 2017-01-07 VITALS — BP 122/85 | HR 86 | Ht 72.0 in | Wt 270.0 lb

## 2017-01-07 DIAGNOSIS — K5901 Slow transit constipation: Secondary | ICD-10-CM | POA: Diagnosis not present

## 2017-01-07 DIAGNOSIS — Z09 Encounter for follow-up examination after completed treatment for conditions other than malignant neoplasm: Secondary | ICD-10-CM

## 2017-01-07 DIAGNOSIS — Z7689 Persons encountering health services in other specified circumstances: Secondary | ICD-10-CM | POA: Diagnosis not present

## 2017-01-07 DIAGNOSIS — L409 Psoriasis, unspecified: Secondary | ICD-10-CM | POA: Insufficient documentation

## 2017-01-07 NOTE — Progress Notes (Signed)
HPI:                                                                Leonard Lucas is a 26 y.o. male who presents to [facility]: [facility] today to establish care / hospital follow-up  Current Concerns include psoriasis and thyroid  Patient was hospitalized 4/20-4/21/18 for fecal impaction and abdominal pain. Patient was discharged on Miralax for 7 days. He reports he has increased his dietary fiber, but is not using any OTC laxatives or stool softeners. He is having a bowel movement every 2 days. He denies fever, chills, nausea, vomiting, abdominal pain, tenesmus, hematochezia, or melena.   Patient is requesting to review his lab results from his hospitalization, specifically he is wondering if his thyroid is normal.  Psoriasis: patient reports history of psoriasis on his scalp, knees and one area of his right foot. His scalp psoriasis is particularly bothersome to him. He has tried topical Fluocinonide, but only saw mild improvement of his symptoms.   Depression/Anxiety: followed by Dr. De Nurse, Psychiatry. Taking Wellbutrin 300 mg without difficulty.  Health Maintenance Health Maintenance  Topic Date Due  . INFLUENZA VACCINE  03/23/2017  . TETANUS/TDAP  03/15/2020  . HIV Screening  Completed    Past Medical History:  Diagnosis Date  . Anxiety   . Bilateral headaches   . Constipation   . Depression   . Obesity   . Psoriasis   . PUD (peptic ulcer disease)   . Upper GI bleed 2016   Past Surgical History:  Procedure Laterality Date  . ESOPHAGOGASTRODUODENOSCOPY N/A 01/21/2015   Procedure: ESOPHAGOGASTRODUODENOSCOPY (EGD);  Surgeon: Lafayette Dragon, MD;  Location: [facility] ENDOSCOPY;  Service: Endoscopy;  Laterality: N/A;  . WISDOM TOOTH EXTRACTION  08/2010   Social History  Substance Use Topics  . Smoking status: Never Smoker  . Smokeless tobacco: Never Used  . Alcohol use No   family history includes Breast cancer in his mother; Depression in  his mother and paternal aunt.  ROS: negative except as noted in the HPI  Medications: Current Outpatient Prescriptions  Medication Sig Dispense Refill  . buPROPion (WELLBUTRIN) 100 MG tablet Take 3 tablets (300 mg total) by mouth daily. 90 tablet 2  . omeprazole (PRILOSEC) 20 MG capsule Take 20 mg by mouth daily as needed (for heartburn/indigestion).     No current facility-administered medications for this visit.    No Known Allergies     Objective:  BP 122/85   Pulse 86   Ht 6' (1.829 m)   Wt 270 lb (122.5 kg)   BMI 36.62 kg/m  Gen: well-groomed, cooperative, not ill-appearing, no distress HEENT: normal conjunctiva, TM's clear, oropharynx clear, moist mucus membranes, no thyromegaly or tenderness Pulm: Normal work of breathing, normal phonation, clear to auscultation bilaterally CV: Normal rate, regular rhythm, s1 and s2 distinct, no murmurs, clicks or rubs, no carotid bruit GI: abdomen soft, nondistended, nontender, no masses Neuro: alert and oriented x 3, EOM's intact, PERRLA, DTR's intact, normal tone, no tremor MSK: moving all extremities, normal gait and station, no peripheral edema Skin: warm and dry, circumscribed, scaly erythematous plaque on left posterior scalp without hair loss; dry, scaly patches on bilateral knees Psych: normal affect, euthymic mood, normal  speech and thought content   No results found for this or any previous visit (from the past 72 hour(s)). No results found.    Assessment and Plan: 26 y.o. male with   1. Hospital discharge follow-up - reviewed patient's labs with patient to include CBC, CMP, TSH, HIV antibody, lipase At discharge, all labs were wnl, except for wbc which was 10.6 and trending downward  2. Encounter to establish care   3. Psoriasis of scalp - failed topical therapy - Ambulatory referral to Dermatology for possible biologics  4. Slow transit constipation - cont high-fiber diet, at least 38g per day - recommend  adding Psyllium supplement if unable to get recommended amount of fiber from diet alone - follow-up with GI doctor as needed   No orders of the defined types were placed in this encounter.   Patient education and anticipatory guidance given Patient agrees with treatment plan Follow-up in 6 months or sooner as needed  Darlyne Russian PA-C

## 2017-01-07 NOTE — Patient Instructions (Addendum)
- Make sure you are getting at least 38g of daily dietary fiber. If you are not getting it from your diet, recommend adding a Psyllium supplement daily   High-Fiber Diet Fiber, also called dietary fiber, is a type of carbohydrate found in fruits, vegetables, whole grains, and beans. A high-fiber diet can have many health benefits. Your health care provider may recommend a high-fiber diet to help:  Prevent constipation. Fiber can make your bowel movements more regular.  Lower your cholesterol.  Relieve hemorrhoids, uncomplicated diverticulosis, or irritable bowel syndrome.  Prevent overeating as part of a weight-loss plan.  Prevent heart disease, type 2 diabetes, and certain cancers. What is my plan? The recommended daily intake of fiber includes:  38 grams for men under age 84.  79 grams for men over age 81.  27 grams for women under age 26.  29 grams for women over age 25. You can get the recommended daily intake of dietary fiber by eating a variety of fruits, vegetables, grains, and beans. Your health care provider may also recommend a fiber supplement if it is not possible to get enough fiber through your diet. What do I need to know about a high-fiber diet?  Fiber supplements have not been widely studied for their effectiveness, so it is better to get fiber through food sources.  Always check the fiber content on thenutrition facts label of any prepackaged food. Look for foods that contain at least 5 grams of fiber per serving.  Ask your dietitian if you have questions about specific foods that are related to your condition, especially if those foods are not listed in the following section.  Increase your daily fiber consumption gradually. Increasing your intake of dietary fiber too quickly may cause bloating, cramping, or gas.  Drink plenty of water. Water helps you to digest fiber. What foods can I eat? Grains  Whole-grain breads. Multigrain cereal. Oats and oatmeal.  Brown rice. Barley. Bulgur wheat. Pax. Bran muffins. Popcorn. Rye wafer crackers. Vegetables  Sweet potatoes. Spinach. Kale. Artichokes. Cabbage. Broccoli. Green peas. Carrots. Squash. Fruits  Berries. Pears. Apples. Oranges. Avocados. Prunes and raisins. Dried figs. Meats and Other Protein Sources  Navy, kidney, pinto, and soy beans. Split peas. Lentils. Nuts and seeds. Dairy  Fiber-fortified yogurt. Beverages  Fiber-fortified soy milk. Fiber-fortified orange juice. Other  Fiber bars. The items listed above may not be a complete list of recommended foods or beverages. Contact your dietitian for more options.  What foods are not recommended? Grains  White bread. Pasta made with refined flour. White rice. Vegetables  Fried potatoes. Canned vegetables. Well-cooked vegetables. Fruits  Fruit juice. Cooked, strained fruit. Meats and Other Protein Sources  Fatty cuts of meat. Fried Sales executive or fried fish. Dairy  Milk. Yogurt. Cream cheese. Sour cream. Beverages  Soft drinks. Other  Cakes and pastries. Butter and oils. The items listed above may not be a complete list of foods and beverages to avoid. Contact your dietitian for more information.  What are some tips for including high-fiber foods in my diet?  Eat a wide variety of high-fiber foods.  Make sure that half of all grains consumed each day are whole grains.  Replace breads and cereals made from refined flour or white flour with whole-grain breads and cereals.  Replace white rice with brown rice, bulgur wheat, or millet.  Start the day with a breakfast that is high in fiber, such as a cereal that contains at least 5 grams of fiber per serving.  Use beans in place of meat in soups, salads, or pasta.  Eat high-fiber snacks, such as berries, raw vegetables, nuts, or popcorn. This information is not intended to replace advice given to you by your health care provider. Make sure you discuss any questions you have with your  health care provider. Document Released: 08/09/2005 Document Revised: 01/15/2016 Document Reviewed: 01/22/2014 Elsevier Interactive Patient Education  2017 [employer].

## 2017-01-09 ENCOUNTER — Encounter: Payer: Self-pay | Admitting: Physician Assistant

## 2017-01-11 MED FILL — buPROPion HCL 100 MG TABS: 100 | 30 days supply | Qty: 90 | Fill #1

## 2017-02-07 MED FILL — buPROPion HCL 100 MG TABS: 100 | 30 days supply | Qty: 90 | Fill #2

## 2017-02-21 ENCOUNTER — Encounter (HOSPITAL_COMMUNITY): Payer: Self-pay | Admitting: Psychiatry

## 2017-03-09 ENCOUNTER — Ambulatory Visit (HOSPITAL_COMMUNITY): Payer: Self-pay | Admitting: Psychiatry

## 2017-03-14 ENCOUNTER — Ambulatory Visit (INDEPENDENT_AMBULATORY_CARE_PROVIDER_SITE_OTHER): Payer: 59 | Admitting: Psychiatry

## 2017-03-14 ENCOUNTER — Encounter (HOSPITAL_COMMUNITY): Payer: Self-pay | Admitting: Psychiatry

## 2017-03-14 DIAGNOSIS — Z79899 Other long term (current) drug therapy: Secondary | ICD-10-CM | POA: Diagnosis not present

## 2017-03-14 DIAGNOSIS — Z818 Family history of other mental and behavioral disorders: Secondary | ICD-10-CM | POA: Diagnosis not present

## 2017-03-14 DIAGNOSIS — F331 Major depressive disorder, recurrent, moderate: Secondary | ICD-10-CM

## 2017-03-14 DIAGNOSIS — F411 Generalized anxiety disorder: Secondary | ICD-10-CM | POA: Diagnosis not present

## 2017-03-14 DIAGNOSIS — F4010 Social phobia, unspecified: Secondary | ICD-10-CM | POA: Diagnosis not present

## 2017-03-14 MED ORDER — BUPROPION HCL 100 MG PO TABS: 300.0000 mg | ORAL_TABLET | Freq: Every day | ORAL | 4 refills | Status: DC

## 2017-03-14 MED FILL — buPROPion HCL 100 MG TABS: 100 | 30 days supply | Qty: 90 | Fill #0

## 2017-03-14 NOTE — Progress Notes (Signed)
Patient ID: Leonard Lucas, male   DOB: Dec 27, 1990, 26 y.o.   MRN: [id number] [facility] Follow-up Outpatient Visit  Leonard Lucas [id number] 25 y.o.  03/14/2017  Chief Complaint: depression follow up   History of Present Illness:    Leonard Lucas is a 26 y/o male with a past psychiatric history significant for symptoms of depression and anxiety. The patient returns for psychiatric services and for medication management.   Patient is doing reasonably regarding depression and anxiety. He is sleeping reasonable he just finished his course at school. At times he does feel blah but doesn't last long history and to keep positive there is no reported side effects.  Severity of depression: 8/10  Past Medical History:  Diagnosis Date  . Acute duodenal ulcer with bleeding 2016  . Anxiety   . Bilateral headaches   . Constipation   . Depression   . Duodenal ulcer hemorrhage   . Obesity   . Psoriasis   . PUD (peptic ulcer disease)   . Upper GI bleed 2016   Family History  Problem Relation Age of Onset  . Breast cancer Mother   . Depression Mother   . Depression Paternal Aunt   . Heart attack Neg Hx   . Stroke Neg Hx     Outpatient Encounter Prescriptions as of 03/14/2017  Medication Sig  . buPROPion (WELLBUTRIN) 100 MG tablet Take 3 tablets (300 mg total) by mouth daily.  Marland Kitchen omeprazole (PRILOSEC) 20 MG capsule Take 20 mg by mouth daily as needed (for heartburn/indigestion).  . [DISCONTINUED] buPROPion (WELLBUTRIN) 100 MG tablet Take 3 tablets (300 mg total) by mouth daily.   No facility-administered encounter medications on file as of 03/14/2017.     No results found for this or any previous visit (from the past 2160 hour(s)).  There were no vitals taken for this visit.   Review of Systems  Constitutional: Negative for fever.  Cardiovascular: Negative for palpitations.  Gastrointestinal: Negative for nausea.  Skin: Negative for rash.  Neurological: Negative for  tingling.  Psychiatric/Behavioral: Negative for depression, substance abuse and suicidal ideas.    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 Generalized anxiety disorder  Treatment Plan:  MDD:  not worse. Continue wellbutrin. Improved  GAd: manageable . Not worse He is more positive today and mood is better Review of side effects questions were addressed follow-up in 4-5  months provided supportive therapy    Leonard Capron, MD

## 2017-04-11 DIAGNOSIS — D229 Melanocytic nevi, unspecified: Secondary | ICD-10-CM | POA: Diagnosis not present

## 2017-04-11 DIAGNOSIS — L409 Psoriasis, unspecified: Secondary | ICD-10-CM | POA: Diagnosis not present

## 2017-04-11 MED FILL — buPROPion HCL 100 MG TABS: 100 | 30 days supply | Qty: 90 | Fill #1

## 2017-04-11 MED FILL — CLOBETASOL PROP 0.05% FOAM: 0.05 | 30 days supply | Qty: 100 | Fill #0

## 2017-05-16 MED FILL — buPROPion HCL 100 MG TABS: 100 | 30 days supply | Qty: 90 | Fill #2

## 2017-06-14 MED FILL — buPROPion HCL 100 MG TABS: 100 | 30 days supply | Qty: 90 | Fill #3

## 2017-07-13 MED FILL — buPROPion HCL 100 MG TABS: 100 | 30 days supply | Qty: 90 | Fill #4

## 2017-08-10 MED FILL — CLOBETASOL PROP 0.05% FOAM: 0.05 | 30 days supply | Qty: 100 | Fill #1

## 2017-08-11 ENCOUNTER — Encounter (HOSPITAL_COMMUNITY): Payer: Self-pay | Admitting: Psychiatry

## 2017-08-11 ENCOUNTER — Other Ambulatory Visit: Payer: Self-pay

## 2017-08-11 ENCOUNTER — Ambulatory Visit (INDEPENDENT_AMBULATORY_CARE_PROVIDER_SITE_OTHER): Payer: 59 | Admitting: Psychiatry

## 2017-08-11 VITALS — BP 118/80 | HR 88 | Ht 72.0 in | Wt 259.0 lb

## 2017-08-11 DIAGNOSIS — F428 Other obsessive-compulsive disorder: Secondary | ICD-10-CM

## 2017-08-11 DIAGNOSIS — F429 Obsessive-compulsive disorder, unspecified: Secondary | ICD-10-CM

## 2017-08-11 DIAGNOSIS — F331 Major depressive disorder, recurrent, moderate: Secondary | ICD-10-CM | POA: Diagnosis not present

## 2017-08-11 DIAGNOSIS — F411 Generalized anxiety disorder: Secondary | ICD-10-CM | POA: Diagnosis not present

## 2017-08-11 DIAGNOSIS — F4010 Social phobia, unspecified: Secondary | ICD-10-CM

## 2017-08-11 DIAGNOSIS — Z79899 Other long term (current) drug therapy: Secondary | ICD-10-CM

## 2017-08-11 DIAGNOSIS — Z818 Family history of other mental and behavioral disorders: Secondary | ICD-10-CM

## 2017-08-11 MED ORDER — BUPROPION HCL 100 MG PO TABS: 300.0000 mg | ORAL_TABLET | Freq: Every day | ORAL | 4 refills | Status: DC

## 2017-08-11 MED FILL — buPROPion HCL 100 MG TABS: 100 | 30 days supply | Qty: 90 | Fill #0

## 2017-08-11 NOTE — Progress Notes (Signed)
Patient ID: ARN MCOMBER, male   DOB: [birth year], 26 y.o.   MRN: [id number] [place] Follow-up Outpatient Visit  Leonard Lucas [id number] 26 y.o.  08/11/2017  Chief Complaint: depression follow up   History of Present Illness:    Leonard Lucas is a 26 y/o male with a past psychiatric history significant for symptoms of depression and anxiety. The patient returns for psychiatric services and for medication management.   Doing reasonable . Not worse. tolearting wellbutrin Planning for school Severity of depression: not worse. Now 8/10 Duration: many years Context finances   Past Medical History:  Diagnosis Date  . Acute duodenal ulcer with bleeding 2016  . Anxiety   . Bilateral headaches   . Constipation   . Depression   . Duodenal ulcer hemorrhage   . Obesity   . Psoriasis   . PUD (peptic ulcer disease)   . Upper GI bleed 2016   Family History  Problem Relation Age of Onset  . Breast cancer Mother   . Depression Mother   . Depression Paternal Aunt   . Heart attack Neg Hx   . Stroke Neg Hx     Outpatient Encounter Medications as of 08/11/2017  Medication Sig  . buPROPion (WELLBUTRIN) 100 MG tablet Take 3 tablets (300 mg total) by mouth daily.  Marland Kitchen omeprazole (PRILOSEC) 20 MG capsule Take 20 mg by mouth daily as needed (for heartburn/indigestion).  . [DISCONTINUED] buPROPion (WELLBUTRIN) 100 MG tablet Take 3 tablets (300 mg total) by mouth daily.   No facility-administered encounter medications on file as of 08/11/2017.     No results found for this or any previous visit (from the past 2160 hour(s)).  BP 118/80 (BP Location: Left Arm, Patient Position: Sitting, Cuff Size: Normal)   Pulse 88   Ht 6' (1.829 m)   Wt 259 lb (117.5 kg)   BMI 35.13 kg/m    Review of Systems  Constitutional: Negative for fever.  Cardiovascular: Negative for chest pain.  Gastrointestinal: Negative for nausea.  Skin: Negative for rash.  Neurological: Negative for  tingling.  Psychiatric/Behavioral: Negative for substance abuse and suicidal ideas.    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 Generalized anxiety disorder  Treatment Plan:  MDD: improved. Continue wellbutrin [id number]. No meds needed for now FU 5-6 months. Renewed meds. Can call back if change to 90 day supply   [name], MD

## 2017-09-06 MED FILL — buPROPion HCL 100 MG TABS: 100 | 30 days supply | Qty: 90 | Fill #1

## 2017-09-28 IMAGING — CT CT ABD-PELV W/ CM
2 of 4 series · 17 of 46 positions shown, 19 images · IV contrast (APPLIED)
Comparison: KUB from yesterday

CLINICAL DATA: Three days of nausea with vomiting and abdominal
pain

EXAM:
CT ABDOMEN AND PELVIS WITH CONTRAST
TECHNIQUE: Multidetector CT imaging of the abdomen and pelvis was performed
using the standard protocol following bolus administration of
intravenous contrast.
CONTRAST:  100mL 9FCGBG-BMM IOPAMIDOL (9FCGBG-BMM) INJECTION 61%

[Series 2: axial st · axial · 0.92mm/px · z∈[-655,-215]mm · 14 of 96 slices shown, 16 images]
[im 4/96  soft-tissue]
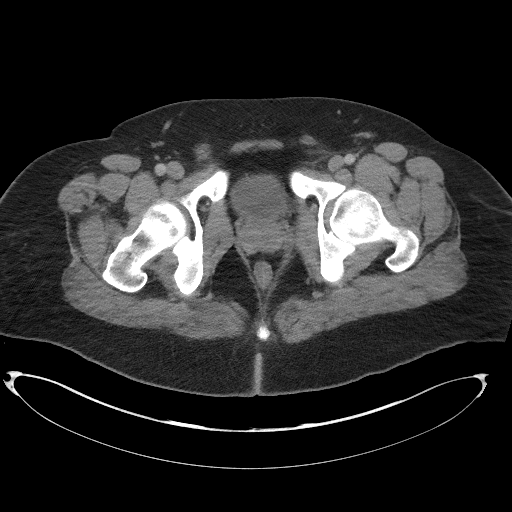
[im 4/96  bone]
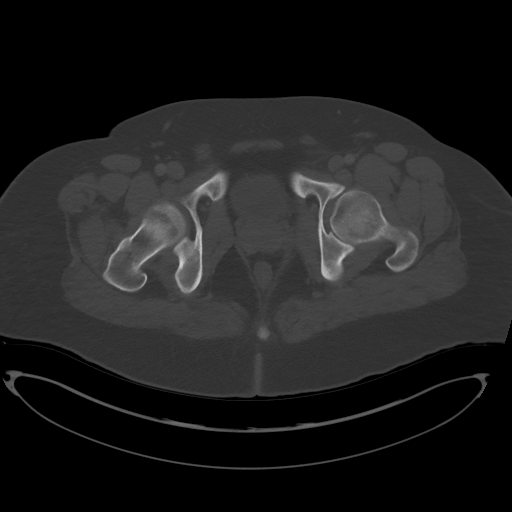
[im 12/96  soft-tissue]
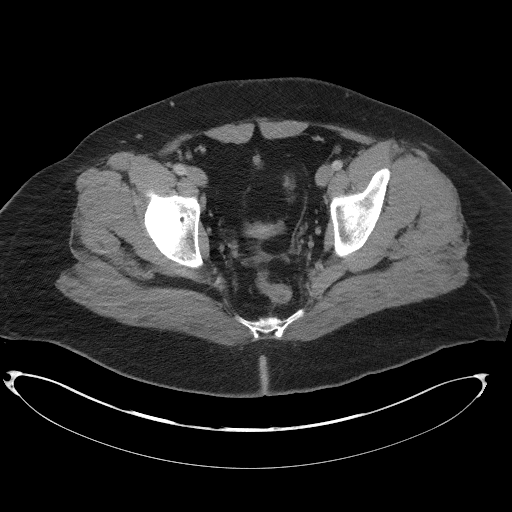
[im 20/96  soft-tissue]
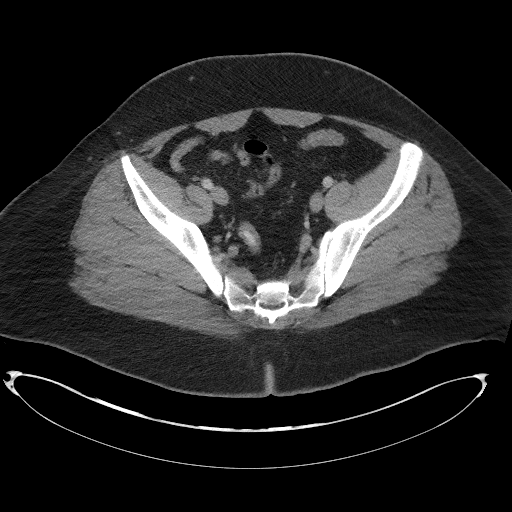
[im 24/96  soft-tissue]
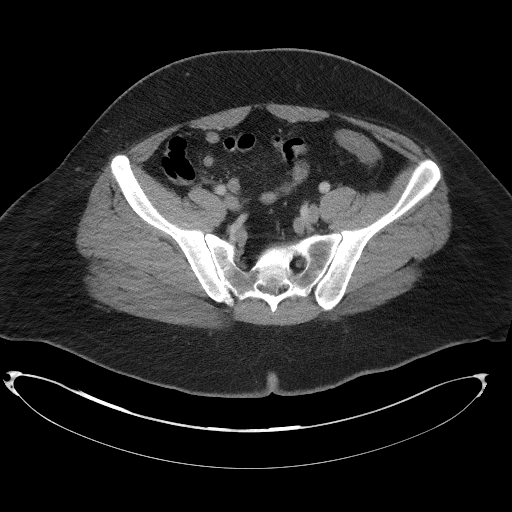
[im 32/96  soft-tissue]
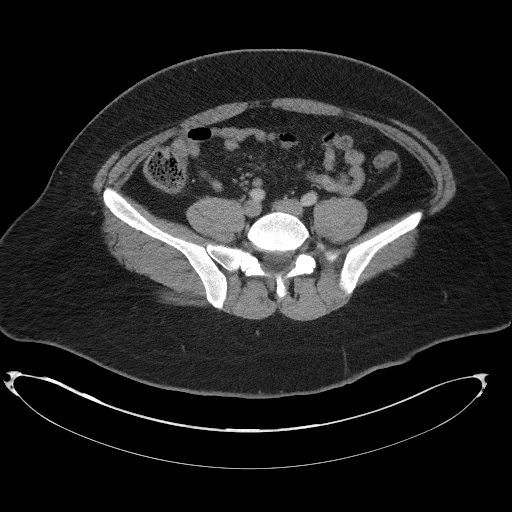
[im 40/96  soft-tissue]
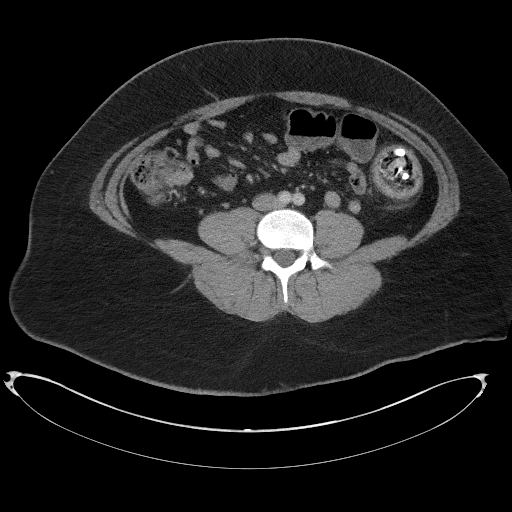
[im 44/96  soft-tissue]
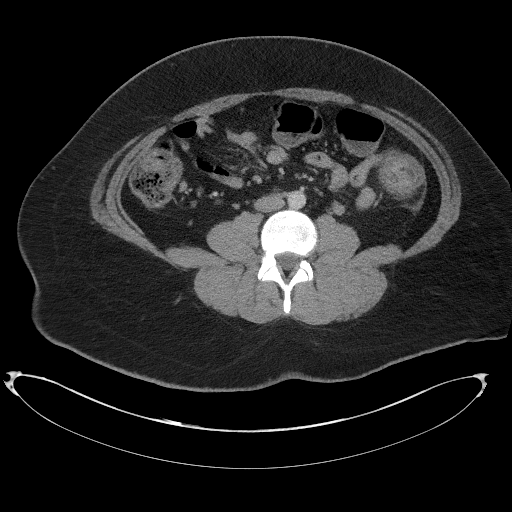
[im 52/96  soft-tissue]
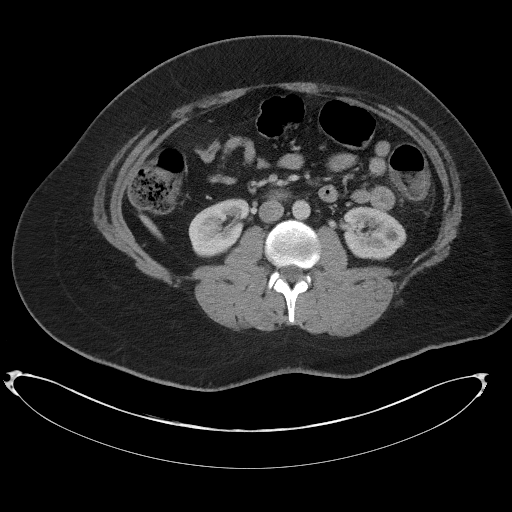
[im 56/96  soft-tissue]
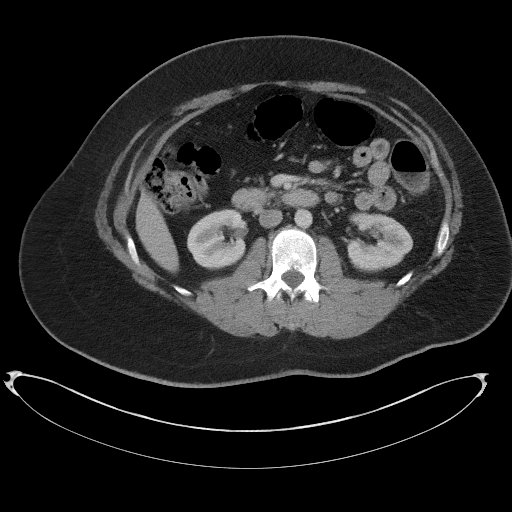
[im 56/96  bone]
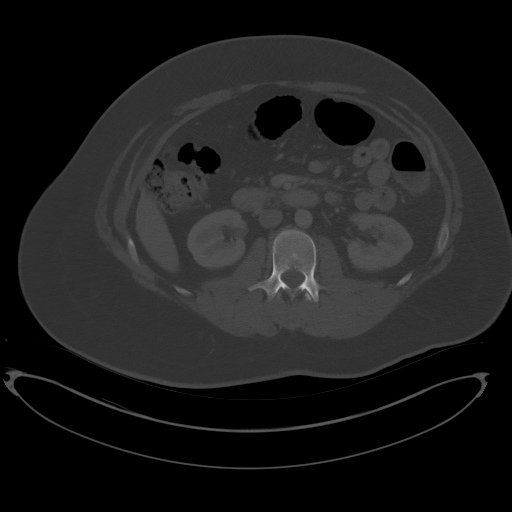
[im 64/96  soft-tissue]
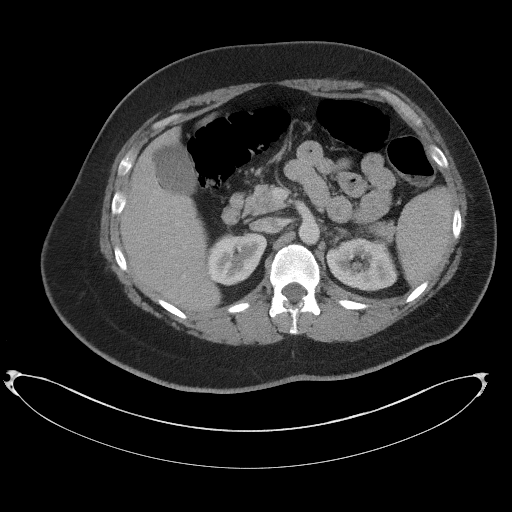
[im 72/96  soft-tissue]
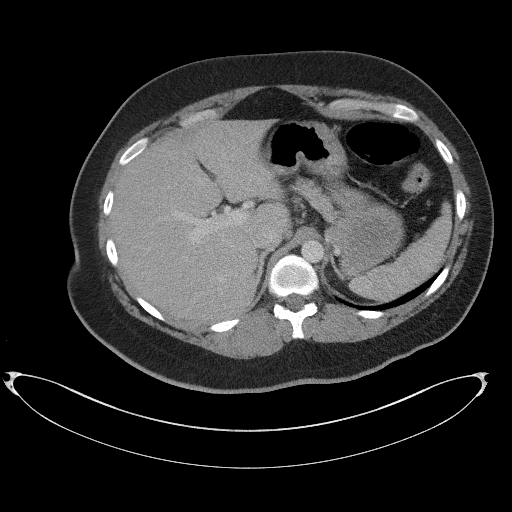
[im 76/96  soft-tissue]
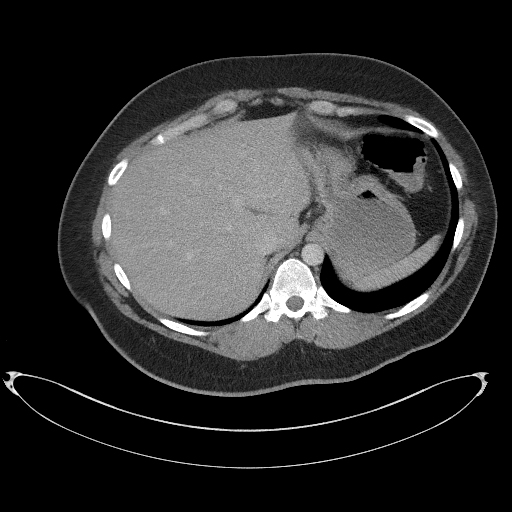
[im 84/96  soft-tissue]
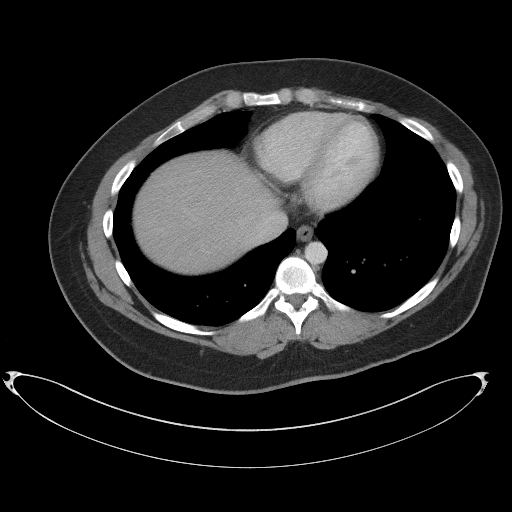
[im 92/96  soft-tissue]
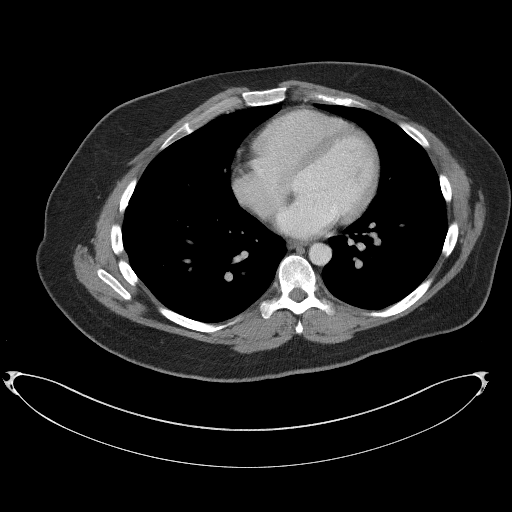

[Series 4: coronal st · coronal · 0.95mm/px · 3 of 111 slices shown]
[im 37/111  soft-tissue]
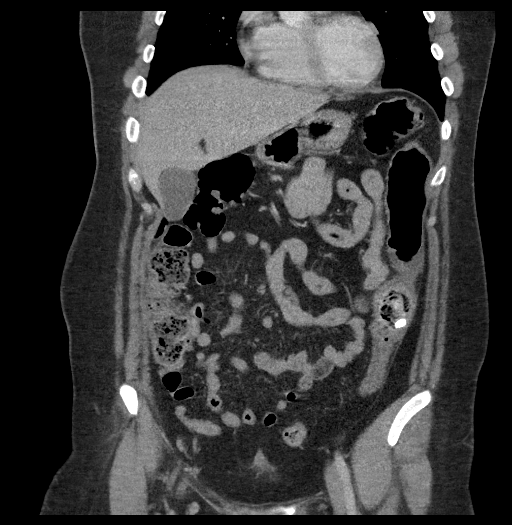
[im 49/111  soft-tissue]
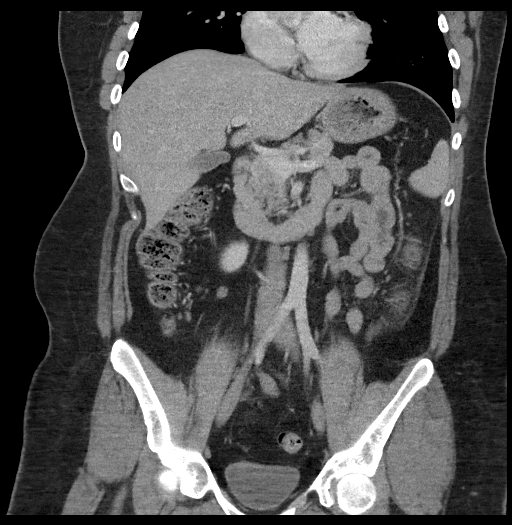
[im 62/111  soft-tissue]
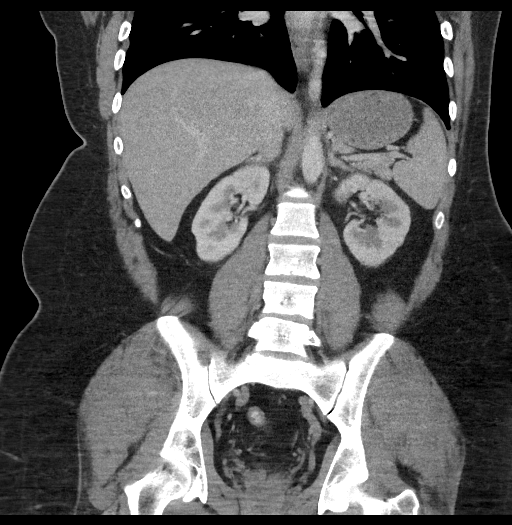

[17 of 46 positions shown; findings below may reference images not displayed]

FINDINGS: Lower chest:  No contributory findings.

Hepatobiliary: No focal liver abnormality.Subtle gas density in the
gallbladder consistent with nitrogen within a gallstone.

Pancreas: Unremarkable.

Spleen: Unremarkable.

Adrenals/Urinary Tract: Negative adrenals. No hydronephrosis or
stone. Unremarkable bladder.

Stomach/Bowel: The colon is dilated and gas-filled to the descending
segment where there is desiccated stool containing at least 3 pills.
Proximal to this level the colon wall is focally thickened from
submucosal edema, with prominent enhancement. Regional mild fat
inflammation. The distal colon is decompressed. No perforation or
abscess is noted. No discrete enhancing masslike lesion. Negative
appendix.

Vascular/Lymphatic: No acute vascular abnormality. No mass or
adenopathy.

Reproductive:No pathologic findings.

Other: Trace pelvic fluid, considered reactive.

Musculoskeletal: No acute abnormalities.
IMPRESSION: 1. Fecalith obstructing the descending colon. Regional colitis may
be obstructive/stercoral colitis, but underlying primary colitis is
not excluded.
2. Cholelithiasis.

## 2017-09-29 IMAGING — DX DG ABDOMEN 1V
2 series · 2 of 2 positions shown · non-contrast
Comparison: Abdominal radiograph 12/09/2016.

CLINICAL DATA: 25-year-old male with history of constipation and
abdominal cramping.

EXAM:
ABDOMEN - 1 VIEW

[abdomen kub (1 of 2)]
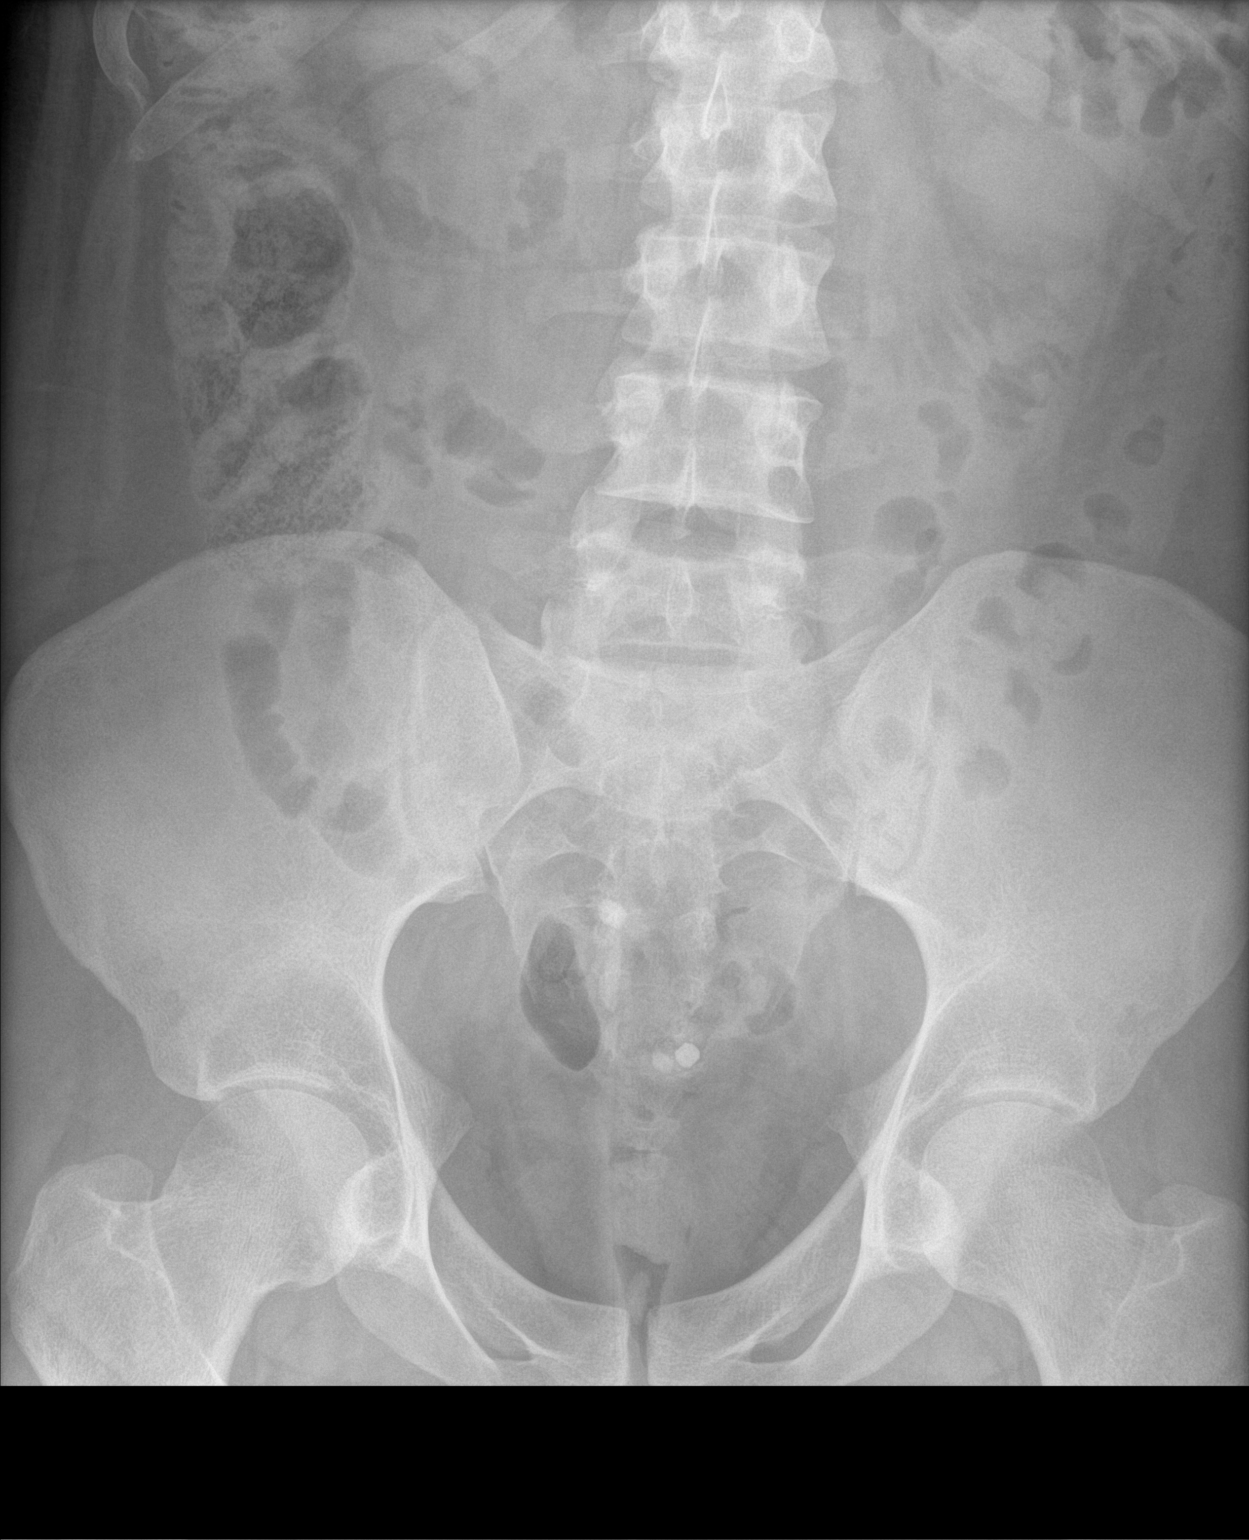

[abdomen kub (2 of 2)]
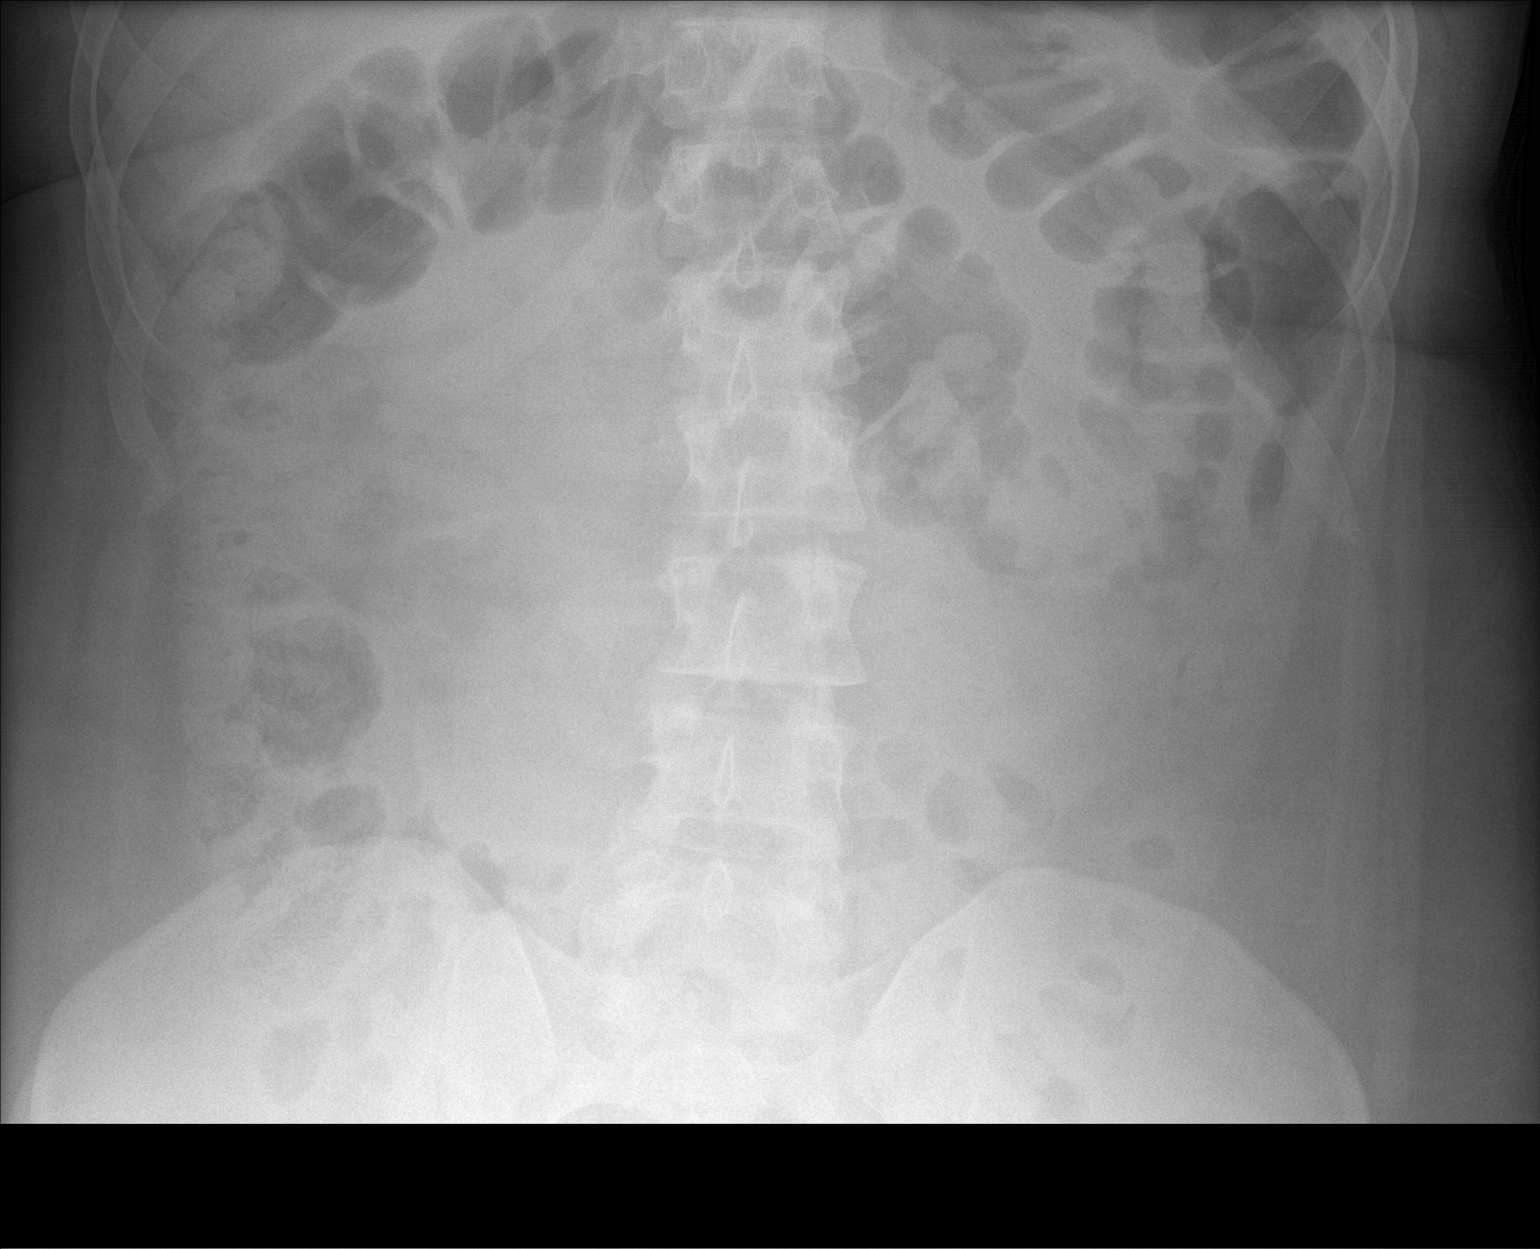

[2 of 2 positions shown; findings below may reference images not displayed]

FINDINGS: Gas and stool are seen scattered throughout the colon extending to
the level of the distal rectum. No pathologic distension of small
bowel is noted. No gross evidence of pneumoperitoneum.
IMPRESSION: 1. Nonobstructive bowel gas pattern.
2. No pneumoperitoneum.

## 2017-10-10 MED FILL — buPROPion HCL 100 MG TABS: 100 | 30 days supply | Qty: 90 | Fill #2

## 2017-10-18 ENCOUNTER — Other Ambulatory Visit: Payer: Self-pay

## 2017-10-18 ENCOUNTER — Emergency Department (INDEPENDENT_AMBULATORY_CARE_PROVIDER_SITE_OTHER)
Admission: EM | Admit: 2017-10-18 | Discharge: 2017-10-18 | Disposition: A | Discharge status: Home / Self Care | Payer: PRIVATE HEALTH INSURANCE | Source: Home / Self Care | Attending: Family Medicine | Admitting: Family Medicine

## 2017-10-18 DIAGNOSIS — Z8719 Personal history of other diseases of the digestive system: Secondary | ICD-10-CM

## 2017-10-18 DIAGNOSIS — R1013 Epigastric pain: Secondary | ICD-10-CM | POA: Diagnosis not present

## 2017-10-18 LAB — POCT CBC W AUTO DIFF (K'VILLE URGENT CARE)

## 2017-10-18 LAB — COMPLETE METABOLIC PANEL WITH GFR
AG RATIO: 1.7 (calc) (ref 1.0–2.5)
ALBUMIN MSPROF: 4.5 g/dL (ref 3.6–5.1)
ALKALINE PHOSPHATASE (APISO): 94 U/L (ref 40–115)
ALT: 16 U/L (ref 9–46)
AST: 14 U/L (ref 10–40)
BILIRUBIN TOTAL: 0.4 mg/dL (ref 0.2–1.2)
BUN: 10 mg/dL (ref 7–25)
CHLORIDE: 102 mmol/L (ref 98–110)
CO2: 28 mmol/L (ref 20–32)
CREATININE: 1.09 mg/dL (ref 0.60–1.35)
Calcium: 9.9 mg/dL (ref 8.6–10.3)
GFR, Est African American: 108 mL/min/{1.73_m2} (ref >=60)
GFR, Est Non African American: 93 mL/min/{1.73_m2} (ref >=60)
GLOBULIN: 2.6 g/dL (ref 1.9–3.7)
Glucose, Bld: 104 mg/dL — ABNORMAL HIGH (ref 65–99)
POTASSIUM: 4 mmol/L (ref 3.5–5.3)
SODIUM: 137 mmol/L (ref 135–146)
Total Protein: 7.1 g/dL (ref 6.1–8.1)

## 2017-10-18 LAB — POCT URINALYSIS DIP (MANUAL ENTRY)
BILIRUBIN UA: NEGATIVE mg/dL
Bilirubin, UA: NEGATIVE
Blood, UA: NEGATIVE
Glucose, UA: NEGATIVE mg/dL
Leukocytes, UA: NEGATIVE
NITRITE UA: NEGATIVE
PH UA: 7 (ref 5.0–8.0)
PROTEIN UA: NEGATIVE mg/dL
Spec Grav, UA: 1.01 (ref 1.010–1.025)
Urobilinogen, UA: 0.2 E.U./dL

## 2017-10-18 MED ORDER — ESOMEPRAZOLE MAGNESIUM 40 MG PO CPDR: DELAYED_RELEASE_CAPSULE | ORAL | 1 refills | Status: AC

## 2017-10-18 NOTE — Discharge Instructions (Signed)
If symptoms become significantly worse during the night or over the weekend, proceed to the local emergency room.  

## 2017-10-18 NOTE — ED Provider Notes (Signed)
[facility]    CSN: [id number] Arrival date & time: 10/18/17  5462     History   Chief Complaint Chief Complaint  Patient presents with  . Abdominal Pain    HPI Leonard Lucas is a 27 y.o. male.   Patient reports that he awoke at about 5am today with epigastric pain.  He took OTC Pepcid and Tylenol and the pain resolved in about 2 hours.  He assymptomatic at present.  He has a significant past GI history of erosive gastritis and duodenitis, duodenal ulcer hemorrhage, slow transit constipation, and fecal impaction in 2018   The history is provided by the patient.  Abdominal Pain  Pain location:  Epigastric Pain quality: burning   Pain radiates to:  Does not radiate Pain severity:  Moderate Onset quality:  Sudden Duration:  2 hours Timing:  Constant Progression:  Resolved Chronicity:  Recurrent Context: awakening from sleep   Relieved by: Pepcid and Tylenol. Worsened by:  Nothing Ineffective treatments:  None tried Associated symptoms: no belching, no chest pain, no chills, no constipation, no cough, no diarrhea, no dysuria, no fatigue, no fever, no flatus, no hematemesis, no hematochezia, no hematuria, no melena, no nausea, no shortness of breath and no vomiting     Past Medical History:  Diagnosis Date  . Acute duodenal ulcer with bleeding 2016  . Anxiety   . Bilateral headaches   . Constipation   . Depression   . Duodenal ulcer hemorrhage   . Obesity   . Psoriasis   . PUD (peptic ulcer disease)   . Upper GI bleed 2016    Patient Active Problem List   Diagnosis Date Noted  . Psoriasis of scalp 01/07/2017  . Constipation   . Fecal impaction of colon ([place]) 12/10/2016  . Obesity, Class III, BMI 40-49.9 (morbid obesity) ([place]) 12/10/2016  . Erosive gastritis   . Leukocytosis   . GI bleed 01/21/2015  . Symptomatic anemia 01/21/2015  . History of migraine 01/21/2015  . Duodenal ulcer hemorrhage   . Bleeding gastrointestinal   . Social anxiety  disorder 07/11/2014  . Depression, major, recurrent, moderate ([place]) 03/16/2012  . Generalized abdominal pain 02/02/2010    Past Surgical History:  Procedure Laterality Date  . ESOPHAGOGASTRODUODENOSCOPY N/A 01/21/2015   Procedure: ESOPHAGOGASTRODUODENOSCOPY (EGD);  Surgeon: Lafayette Dragon, MD;  Location: [facility] ENDOSCOPY;  Service: Endoscopy;  Laterality: N/A;  . WISDOM TOOTH EXTRACTION  08/2010       Home Medications    Prior to Admission medications   Medication Sig Start Date End Date Taking? Authorizing Provider  buPROPion (WELLBUTRIN) 100 MG tablet Take 3 tablets (300 mg total) by mouth daily. 08/11/17   [name], MD  esomeprazole (NEXIUM) 40 MG capsule Take one cap by mouth twice daily, 20 to 30 minutes before a meal. 10/18/17   Kandra Nicolas, MD  omeprazole (PRILOSEC) 20 MG capsule Take 20 mg by mouth daily as needed (for heartburn/indigestion).    [provider]  ferrous sulfate 325 (65 FE) MG tablet Take 1 tablet (325 mg total) by mouth daily with breakfast. Patient not taking: Reported on 11/25/2015 01/24/15 02/26/16  Robbie Lis, MD  FLUoxetine (PROZAC) 20 MG tablet Take 3 tablets (60 mg total) by mouth daily. 11/25/15 12/17/15  [name], MD    Family History Family History  Problem Relation Age of Onset  . Breast cancer Mother   . Depression Mother   . Depression Paternal Aunt   . Heart attack  Neg Hx   . Stroke Neg Hx     Social History Social History   Tobacco Use  . Smoking status: Never Smoker  . Smokeless tobacco: Never Used  Substance Use Topics  . Alcohol use: No    Alcohol/week: 0.0 oz  . Drug use: No     Allergies   Patient has no known allergies.   Review of Systems Review of Systems  Constitutional: Negative for chills, fatigue and fever.  Respiratory: Negative for cough and shortness of breath.   Cardiovascular: Negative for chest pain.  Gastrointestinal: Positive for abdominal pain. Negative for constipation, diarrhea,  flatus, hematemesis, hematochezia, melena, nausea and vomiting.  Genitourinary: Negative for dysuria and hematuria.  All other systems reviewed and are negative.    Physical Exam Triage Vital Signs ED Triage Vitals  Enc Vitals Group     BP 10/18/17 0900 127/82     Pulse Rate 10/18/17 0900 83     Resp --      Temp 10/18/17 0900 98 F (36.7 C)     Temp Source 10/18/17 0900 Oral     SpO2 10/18/17 0900 100 %     Weight 10/18/17 0902 272 lb (123.4 kg)     Height 10/18/17 0902 6' (1.829 m)     Head Circumference --      Peak Flow --      Pain Score 10/18/17 0902 0     Pain Loc --      Pain Edu? --      Excl. in [place]? --    No data found.  Updated Vital Signs BP 127/82 (BP Location: Right Arm)   Pulse 83   Temp 98 F (36.7 C) (Oral)   Ht 6' (1.829 m)   Wt 272 lb (123.4 kg)   SpO2 100%   BMI 36.89 kg/m   Visual Acuity Right Eye Distance:   Left Eye Distance:   Bilateral Distance:    Right Eye Near:   Left Eye Near:    Bilateral Near:     Physical Exam Nursing notes and Vital Signs reviewed. Appearance:  Patient appears stated age, and in no acute distress.    Eyes:  Pupils are equal, round, and reactive to light and accomodation.  Extraocular movement is intact.  Conjunctivae are not inflamed   Pharynx:  Normal; moist mucous membranes  Neck:  Supple.  No adenopathy Lungs:  Clear to auscultation.  Breath sounds are equal.  Moving air well. Heart:  Regular rate and rhythm without murmurs, rubs, or gallops.  Abdomen:   Vague mild peri-umbilcal and sub-xiphoid tenderness without masses or hepatosplenomegaly.  Bowel sounds are present.  No CVA or flank tenderness.  Extremities:  No edema.  Skin:  No rash present.     UC Treatments / Results  Labs (all labs ordered are listed, but only abnormal results are displayed) Labs Reviewed  COMPLETE METABOLIC PANEL WITH GFR  POCT URINALYSIS DIP (MANUAL ENTRY) negative  POCT CBC W AUTO DIFF ([facility]):  WBC 6.5; LY  23.6; MO 3.7; GR 72.7; Hgb 14.5; Platelets 313     EKG  EKG Interpretation None       Radiology No results found.  Procedures Procedures (including critical care time)  Medications Ordered in UC Medications - No data to display   Initial Impression / Assessment and Plan / UC Course  I have reviewed the triage vital signs and the nursing notes.  Pertinent labs & imaging results that were  available during my care of the patient were reviewed by me and considered in my medical decision making (see chart for details).    Normal CBC and urinalysis reassuring.  CMP pending. Begin Nexium 40mg  BID; #60, one refill. Patient has significant GI history of gastritis and gastroduodenitis, peptic ulcer disease, past duodenal ulcer hemorrhage, past fecal impaction and slow transit constipation. Highly recommend that he establish relationship with gastroenterologist for regular followup.    Final Clinical Impressions(s) / UC Diagnoses   Final diagnoses:  Epigastric pain  History of gastritis    ED Discharge Orders        Ordered    esomeprazole (NEXIUM) 40 MG capsule     10/18/17 0954          Kandra Nicolas, MD 10/28/17 [phone]

## 2017-10-18 NOTE — ED Triage Notes (Signed)
Pt woke with epigastric pain this am around 5 am.  Pt has a hx of ulcers, and thinks it may be from that.  Took 4 acetaminophen, and it resolved in about 2 hours.  Denies pain now.

## 2017-10-19 ENCOUNTER — Telehealth: Payer: Self-pay | Admitting: Emergency Medicine

## 2017-11-08 MED FILL — buPROPion HCL 100 MG TABS: 100 | 30 days supply | Qty: 90 | Fill #3

## 2017-12-12 MED FILL — buPROPion HCL 100 MG TABS: 100 | 30 days supply | Qty: 90 | Fill #4

## 2018-01-10 ENCOUNTER — Other Ambulatory Visit (HOSPITAL_COMMUNITY): Payer: Self-pay | Admitting: Psychiatry

## 2018-01-11 MED ORDER — BUPROPION HCL 100 MG PO TABS: 300.0000 mg | ORAL_TABLET | Freq: Every day | ORAL | 0 refills | Status: DC

## 2018-01-11 MED FILL — buPROPion HCL 100 MG TABS: 100 | 30 days supply | Qty: 90 | Fill #0

## 2018-01-11 NOTE — Telephone Encounter (Signed)
Sent over a one month supply on bupropion 100mg . Left VM informing patient of refill.

## 2018-01-11 NOTE — Telephone Encounter (Signed)
Pt left VM up front that he needed a refill and needed to [facility].  Called pt to verify information and LM.

## 2018-01-11 NOTE — Telephone Encounter (Signed)
Pt needs refill on buproprion [facility]

## 2018-01-24 ENCOUNTER — Ambulatory Visit (HOSPITAL_COMMUNITY): Payer: Self-pay | Admitting: Psychiatry

## 2018-01-25 ENCOUNTER — Ambulatory Visit (HOSPITAL_COMMUNITY): Payer: Self-pay | Admitting: Psychiatry

## 2018-02-06 ENCOUNTER — Telehealth (HOSPITAL_COMMUNITY): Payer: Self-pay | Admitting: Psychiatry

## 2018-02-06 NOTE — Telephone Encounter (Signed)
Pt needs refill on wellbutrin sent to [facility] outpatient pharmacy

## 2018-02-07 MED ORDER — BUPROPION HCL 100 MG PO TABS: 300.0000 mg | ORAL_TABLET | Freq: Every day | ORAL | 0 refills | Status: DC

## 2018-02-07 MED FILL — buPROPion HCL 100 MG TABS: 100 | 30 days supply | Qty: 90 | Fill #0

## 2018-02-07 NOTE — Telephone Encounter (Signed)
Sent a one month supply to the pharmacy. Left vm informing patient.

## 2018-02-16 ENCOUNTER — Encounter (HOSPITAL_COMMUNITY): Payer: Self-pay | Admitting: Psychiatry

## 2018-02-16 ENCOUNTER — Ambulatory Visit (INDEPENDENT_AMBULATORY_CARE_PROVIDER_SITE_OTHER): Payer: PRIVATE HEALTH INSURANCE | Admitting: Psychiatry

## 2018-02-16 ENCOUNTER — Other Ambulatory Visit: Payer: Self-pay

## 2018-02-16 VITALS — BP 138/92 | HR 72 | Ht 72.0 in | Wt 276.0 lb

## 2018-02-16 DIAGNOSIS — F331 Major depressive disorder, recurrent, moderate: Secondary | ICD-10-CM | POA: Diagnosis not present

## 2018-02-16 DIAGNOSIS — F411 Generalized anxiety disorder: Secondary | ICD-10-CM | POA: Diagnosis not present

## 2018-02-16 DIAGNOSIS — F4010 Social phobia, unspecified: Secondary | ICD-10-CM | POA: Diagnosis not present

## 2018-02-16 DIAGNOSIS — F428 Other obsessive-compulsive disorder: Secondary | ICD-10-CM | POA: Diagnosis not present

## 2018-02-16 MED ORDER — BUPROPION HCL 100 MG PO TABS
300.0000 mg | ORAL_TABLET | Freq: Every day | ORAL | 5 refills | Status: DC
Start: 2018-02-16 — End: 2018-09-07

## 2018-02-16 NOTE — Progress Notes (Signed)
Patient ID: KARTEL WOLBERT, male   DOB: [birth year], 27 y.o.   MRN: [id number] [place] Follow-up Outpatient Visit  NELLO CORRO [id number] 27 y.o.  02/16/2018  Chief Complaint: depression follow up   History of Present Illness:    Mr. Bhagat is a 27 y/o male with a past psychiatric history significant for symptoms of depression and anxiety. The patient returns for psychiatric services and for medication management.   Doing fair. Graduating from college course this month Tolerating wellbutrin no side effects  fianances stressful but otherwise looking for different job soon    Past Medical History:  Diagnosis Date  . Acute duodenal ulcer with bleeding 2016  . Anxiety   . Bilateral headaches   . Constipation   . Depression   . Duodenal ulcer hemorrhage   . Obesity   . Psoriasis   . PUD (peptic ulcer disease)   . Upper GI bleed 2016   Family History  Problem Relation Age of Onset  . Breast cancer Mother   . Depression Mother   . Depression Paternal Aunt   . Heart attack Neg Hx   . Stroke Neg Hx     Outpatient Encounter Medications as of 02/16/2018  Medication Sig  . buPROPion (WELLBUTRIN) 100 MG tablet Take 3 tablets (300 mg total) by mouth daily.  Marland Kitchen esomeprazole (NEXIUM) 40 MG capsule Take one cap by mouth twice daily, 20 to 30 minutes before a meal.  . omeprazole (PRILOSEC) 20 MG capsule Take 20 mg by mouth daily as needed (for heartburn/indigestion).  . [DISCONTINUED] buPROPion (WELLBUTRIN) 100 MG tablet Take 3 tablets (300 mg total) by mouth daily.  . [DISCONTINUED] FLUoxetine (PROZAC) 20 MG tablet Take 3 tablets (60 mg total) by mouth daily.   No facility-administered encounter medications on file as of 02/16/2018.     No results found for this or any previous visit (from the past 2160 hour(s)).  BP (!) 138/92 (BP Location: Left Arm, Patient Position: Sitting, Cuff Size: Normal)   Pulse 72   Ht 6' (1.829 m)   Wt 276 lb (125.2 kg)   BMI 37.43  kg/m    Review of Systems  Constitutional: Negative for fever.  Cardiovascular: Negative for chest pain.  Gastrointestinal: Negative for nausea.  Skin: Negative for rash.  Neurological: Negative for tingling.  Psychiatric/Behavioral: Negative for depression, substance abuse and suicidal ideas.    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 Obsessive-compulsive traits. Generalized anxiety disorder  Treatment Plan:  JTT:[id number]. Continue wellbutrin [id number]. No meds needed for now Fu 6-9 months. Renewed meds   [name], MD

## 2018-03-14 MED FILL — buPROPion HCL 100 MG TABS: 100 | 30 days supply | Qty: 90 | Fill #0

## 2018-04-13 MED FILL — buPROPion HCL 100 MG TABS: 100 | 30 days supply | Qty: 90 | Fill #1

## 2018-05-10 MED FILL — buPROPion HCL 100 MG TABS: 100 | 30 days supply | Qty: 90 | Fill #2

## 2018-06-12 MED FILL — buPROPion HCL 100 MG TABS: 100 | 30 days supply | Qty: 90 | Fill #3

## 2018-07-10 MED FILL — buPROPion HCL 100 MG TABS: 100 | 30 days supply | Qty: 90 | Fill #4

## 2018-08-07 MED FILL — buPROPion HCL 100 MG TABS: 100 | 30 days supply | Qty: 90 | Fill #5

## 2018-09-07 ENCOUNTER — Other Ambulatory Visit (HOSPITAL_COMMUNITY): Payer: Self-pay | Admitting: Psychiatry

## 2018-09-07 MED ORDER — BUPROPION HCL 100 MG PO TABS: 300.0000 mg | ORAL_TABLET | Freq: Every day | ORAL | 2 refills | Status: DC

## 2018-09-07 MED FILL — buPROPion HCL 100 MG TABS: 100 | 30 days supply | Qty: 90 | Fill #0

## 2018-09-07 NOTE — Telephone Encounter (Signed)
Pt needs refill on [facility]upropion [facility] to [facility]

## 2018-09-07 NOTE — Telephone Encounter (Signed)
Sent medication refill to pharmacy. Left vm informing patient.

## 2018-10-09 MED FILL — buPROPion HCL 100 MG TABS: 100 | 30 days supply | Qty: 90 | Fill #1

## 2018-11-06 MED FILL — buPROPion HCL 100 MG TABS: 100 | 30 days supply | Qty: 90 | Fill #2

## 2018-11-08 ENCOUNTER — Encounter (HOSPITAL_COMMUNITY): Payer: Self-pay | Admitting: Psychiatry

## 2018-11-08 ENCOUNTER — Other Ambulatory Visit: Payer: Self-pay

## 2018-11-08 ENCOUNTER — Ambulatory Visit (INDEPENDENT_AMBULATORY_CARE_PROVIDER_SITE_OTHER): Payer: PRIVATE HEALTH INSURANCE | Admitting: Psychiatry

## 2018-11-08 VITALS — BP 132/88 | HR 61 | Ht 72.0 in | Wt 270.0 lb

## 2018-11-08 DIAGNOSIS — F4010 Social phobia, unspecified: Secondary | ICD-10-CM

## 2018-11-08 DIAGNOSIS — F411 Generalized anxiety disorder: Secondary | ICD-10-CM

## 2018-11-08 DIAGNOSIS — F331 Major depressive disorder, recurrent, moderate: Secondary | ICD-10-CM

## 2018-11-08 MED ORDER — BUPROPION HCL 100 MG PO TABS: 300.0000 mg | ORAL_TABLET | Freq: Every day | ORAL | 5 refills | Status: DC

## 2018-11-08 NOTE — Progress Notes (Signed)
Patient ID: [facility], male   DOB: [birth year], 28 y.o.   MRN: [id number] [place] Follow-up Outpatient Visit  Leonard Lucas [id number] 28 y.o.  11/08/2018  Chief Complaint: depression follow up   History of Present Illness:    Leonard Lucas is a 28 y/o male with a past psychiatric history significant for symptoms of depression and anxiety. The patient returns for psychiatric services and for medication management.   Doing fair. Tolerating meds, self esteem still not where he wants to be.  fiancial stressors but handling it for now    Past Medical History:  Diagnosis Date  . Acute duodenal ulcer with bleeding 2016  . Anxiety   . Bilateral headaches   . Constipation   . Depression   . Duodenal ulcer hemorrhage   . Obesity   . Psoriasis   . PUD (peptic ulcer disease)   . Upper GI bleed 2016   Family History  Problem Relation Age of Onset  . Breast cancer Mother   . Depression Mother   . Depression Paternal Aunt   . Heart attack Neg Hx   . Stroke Neg Hx     Outpatient Encounter Medications as of 11/08/2018  Medication Sig  . buPROPion (WELLBUTRIN) 100 MG tablet Take 3 tablets (300 mg total) by mouth daily.  Marland Kitchen esomeprazole (NEXIUM) 40 MG capsule Take one cap by mouth twice daily, 20 to 30 minutes before a meal.  . omeprazole (PRILOSEC) 20 MG capsule Take 20 mg by mouth daily as needed (for heartburn/indigestion).  . [DISCONTINUED] buPROPion (WELLBUTRIN) 100 MG tablet Take 3 tablets (300 mg total) by mouth daily.  . [DISCONTINUED] FLUoxetine (PROZAC) 20 MG tablet Take 3 tablets (60 mg total) by mouth daily.   No facility-administered encounter medications on file as of 11/08/2018.     No results found for this or any previous visit (from the past 2160 hour(s)).  BP 132/88 (BP Location: Left Arm, Patient Position: Sitting, Cuff Size: Normal)   Pulse 61   Ht 6' (1.829 m)   Wt 270 lb (122.5 kg)   BMI 36.62 kg/m    Review of Systems  Constitutional:  Negative for fever.  Cardiovascular: Negative for palpitations.  Gastrointestinal: Negative for nausea.  Skin: Negative for rash.  Neurological: Negative for tingling.  Psychiatric/Behavioral: Negative for depression, substance abuse and suicidal ideas.    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 Obsessive-compulsive traits. Generalized anxiety disorder  Treatment Plan:  TKW:IOXBD fair. Continue wellbutrin [id number] or not worse. No meds needed for now Fu 6-9 months. Renewed meds   [name], MD

## 2018-12-06 MED FILL — buPROPion HCL 100 MG TABS: 100 | 30 days supply | Qty: 90 | Fill #0

## 2019-01-09 MED FILL — buPROPion HCL 100 MG TABS: 100 | 30 days supply | Qty: 90 | Fill #1

## 2019-02-05 MED FILL — buPROPion HCL 100 MG TABS: 100 | 30 days supply | Qty: 90 | Fill #2

## 2019-03-06 MED FILL — buPROPion HCL 100 MG TABS: 100 | 30 days supply | Qty: 90 | Fill #3

## 2019-04-02 MED FILL — buPROPion HCL 100 MG TABS: 100 | 30 days supply | Qty: 90 | Fill #4

## 2019-05-02 ENCOUNTER — Ambulatory Visit (HOSPITAL_COMMUNITY): Payer: PRIVATE HEALTH INSURANCE | Admitting: Psychiatry

## 2019-05-03 ENCOUNTER — Ambulatory Visit (INDEPENDENT_AMBULATORY_CARE_PROVIDER_SITE_OTHER): Payer: PRIVATE HEALTH INSURANCE | Admitting: Psychiatry

## 2019-05-03 ENCOUNTER — Encounter (HOSPITAL_COMMUNITY): Payer: Self-pay | Admitting: Psychiatry

## 2019-05-03 DIAGNOSIS — F411 Generalized anxiety disorder: Secondary | ICD-10-CM | POA: Diagnosis not present

## 2019-05-03 DIAGNOSIS — F4010 Social phobia, unspecified: Secondary | ICD-10-CM | POA: Diagnosis not present

## 2019-05-03 DIAGNOSIS — F428 Other obsessive-compulsive disorder: Secondary | ICD-10-CM

## 2019-05-03 DIAGNOSIS — F331 Major depressive disorder, recurrent, moderate: Secondary | ICD-10-CM

## 2019-05-03 MED ORDER — BUPROPION HCL 100 MG PO TABS: 300.0000 mg | ORAL_TABLET | Freq: Every day | ORAL | 5 refills | Status: DC

## 2019-05-03 MED FILL — buPROPion HCL 100 MG TABS: 100 | 30 days supply | Qty: 90 | Fill #0

## 2019-05-03 NOTE — Progress Notes (Signed)
Patient ID: [facility], male   DOB: 1991-04-22, 28 y.o.   MRN: [id number] [place] Follow-up Outpatient Visit  Leonard Lucas [id number] 28 y.o.  05/03/2019  Chief Complaint: depression follow up   History of Present Illness:   I connected with [facility] Lucas on 05/03/19 at  3:45 PM EDT by telephone and verified that I am speaking with the correct person using two identifiers.   I discussed the limitations, risks, security and privacy concerns of performing an evaluation and management service by telephone and the availability of in person appointments. I also discussed with the patient that there may be a patient responsible charge related to this service. The patient expressed understanding and agreed to proceed.   Leonard Lucas is a 28 y/o male with a past psychiatric history significant for symptoms of depression and anxiety.  Doing fair, working in retail. Not worse wellbutrin helps depression    Past Medical History:  Diagnosis Date  . Acute duodenal ulcer with bleeding 2016  . Anxiety   . Bilateral headaches   . Constipation   . Depression   . Duodenal ulcer hemorrhage   . Obesity   . Psoriasis   . PUD (peptic ulcer disease)   . Upper GI bleed 2016   Family History  Problem Relation Age of Onset  . Breast cancer Mother   . Depression Mother   . Depression Paternal Aunt   . Heart attack Neg Hx   . Stroke Neg Hx     Outpatient Encounter Medications as of 05/03/2019  Medication Sig  . buPROPion (WELLBUTRIN) 100 MG tablet Take 3 tablets (300 mg total) by mouth daily.  Marland Kitchen esomeprazole (NEXIUM) 40 MG capsule Take one cap by mouth twice daily, 20 to 30 minutes before a meal.  . omeprazole (PRILOSEC) 20 MG capsule Take 20 mg by mouth daily as needed (for heartburn/indigestion).  . [DISCONTINUED] buPROPion (WELLBUTRIN) 100 MG tablet Take 3 tablets (300 mg total) by mouth daily.  . [DISCONTINUED] FLUoxetine (PROZAC) 20 MG tablet Take 3 tablets (60 mg total)  by mouth daily.   No facility-administered encounter medications on file as of 05/03/2019.     No results found for this or any previous visit (from the past 2160 hour(s)).  There were no vitals taken for this visit.   Review of Systems  Cardiovascular: Negative for palpitations.  Skin: Negative for rash.  Psychiatric/Behavioral: Negative for substance abuse and suicidal ideas.    Mental Status Examination  Appearance: casual' Alert: Yes Attention: fair  Cooperative: Yes Eye Contact: Fair Speech: normal tone Psychomotor Activity: Decreased Memory/Concentration: adequate  Oriented: person, place and time/date Mood: fair Affect: congruent and pleasant Thought Processes and Associations: Coherent Fund of Knowledge: Fair Thought Content: Suicidal ideation and Homicidal ideation were denied Insight: Fair Judgement: Fair  Diagnosis: Maj. depressive disorder recurrent moderate . Obsessive-compulsive traits. Generalized anxiety disorder  Treatment Plan:  MDD: doing fair continue wellbutrin Add more activities for the day  [id number] or not worse. No meds needed for now Fu 6-9 months. Renewed meds     I discussed the assessment and treatment plan with the patient. The patient was provided an opportunity to ask questions and all were answered. The patient agreed with the plan and demonstrated an understanding of the instructions.   The patient was advised to call back or seek an in-person evaluation if the symptoms worsen or if the condition fails to improve as anticipated. [name], MD

## 2019-06-06 MED FILL — buPROPion HCL 100 MG TABS: 100 | 30 days supply | Qty: 90 | Fill #1

## 2019-07-02 MED FILL — buPROPion HCL 100 MG TABS: 100 | 30 days supply | Qty: 90 | Fill #2

## 2019-07-30 MED FILL — buPROPion HCL 100 MG TABS: 100 | 30 days supply | Qty: 90 | Fill #3

## 2019-09-03 MED FILL — buPROPion HCL 100 MG TABS: 100 | 30 days supply | Qty: 90 | Fill #4

## 2019-09-14 ENCOUNTER — Other Ambulatory Visit: Payer: Self-pay

## 2019-09-14 ENCOUNTER — Emergency Department (INDEPENDENT_AMBULATORY_CARE_PROVIDER_SITE_OTHER)
Admission: EM | Admit: 2019-09-14 | Discharge: 2019-09-14 | Disposition: A | Discharge status: Home / Self Care | Payer: PRIVATE HEALTH INSURANCE | Source: Home / Self Care

## 2019-09-14 DIAGNOSIS — H6123 Impacted cerumen, bilateral: Secondary | ICD-10-CM

## 2019-09-14 NOTE — ED Provider Notes (Signed)
[facility]    CSN: [id number] Arrival date & time: 09/14/19  1146      History   Chief Complaint Chief Complaint  Patient presents with  . Ear Fullness    left    HPI Leonard Lucas is a 29 y.o. male.   The history is provided by the patient.  Ear Fullness This is a new problem. The problem occurs constantly. The problem has not changed since onset.Nothing aggravates the symptoms. Nothing relieves the symptoms. He has tried nothing for the symptoms. The treatment provided no relief.  Pt complains of wax blocking both ears   Past Medical History:  Diagnosis Date  . Acute duodenal ulcer with bleeding 2016  . Anxiety   . Bilateral headaches   . Constipation   . Depression   . Duodenal ulcer hemorrhage   . Obesity   . Psoriasis   . PUD (peptic ulcer disease)   . Upper GI bleed 2016    Patient Active Problem List   Diagnosis Date Noted  . Psoriasis of scalp 01/07/2017  . Constipation   . Fecal impaction of colon ([place]) 12/10/2016  . Obesity, Class III, BMI 40-49.9 (morbid obesity) ([place]) 12/10/2016  . Erosive gastritis   . Leukocytosis   . GI bleed 01/21/2015  . Symptomatic anemia 01/21/2015  . History of migraine 01/21/2015  . Duodenal ulcer hemorrhage   . Bleeding gastrointestinal   . Social anxiety disorder 07/11/2014  . Depression, major, recurrent, moderate ([place]) 03/16/2012  . Generalized abdominal pain 02/02/2010    Past Surgical History:  Procedure Laterality Date  . ESOPHAGOGASTRODUODENOSCOPY N/A 01/21/2015   Procedure: ESOPHAGOGASTRODUODENOSCOPY (EGD);  Surgeon: Lafayette Dragon, MD;  Location: [facility] ENDOSCOPY;  Service: Endoscopy;  Laterality: N/A;  . WISDOM TOOTH EXTRACTION  08/2010       Home Medications    Prior to Admission medications   Medication Sig Start Date End Date Taking? Authorizing Provider  buPROPion (WELLBUTRIN) 100 MG tablet Take 3 tablets (300 mg total) by mouth daily. 05/03/19   [name], MD  esomeprazole (NEXIUM)  40 MG capsule Take one cap by mouth twice daily, 20 to 30 minutes before a meal. 10/18/17   Kandra Nicolas, MD  omeprazole (PRILOSEC) 20 MG capsule Take 20 mg by mouth daily as needed (for heartburn/indigestion).    [provider]  FLUoxetine (PROZAC) 20 MG tablet Take 3 tablets (60 mg total) by mouth daily. 11/25/15 12/17/15  [name], MD    Family History Family History  Problem Relation Age of Onset  . Breast cancer Mother   . Depression Mother   . Depression Paternal Aunt   . Heart attack Neg Hx   . Stroke Neg Hx     Social History Social History   Tobacco Use  . Smoking status: Never Smoker  . Smokeless tobacco: Never Used  Substance Use Topics  . Alcohol use: No    Alcohol/week: 0.0 standard drinks  . Drug use: No     Allergies   Patient has no known allergies.   Review of Systems Review of Systems  HENT: Positive for ear pain.   All other systems reviewed and are negative.    Physical Exam Triage Vital Signs ED Triage Vitals  Enc Vitals Group     BP 09/14/19 1203 135/79     Pulse Rate 09/14/19 1203 92     Resp 09/14/19 1203 20     Temp 09/14/19 1203 98.2 F (36.8 C)  Temp Source 09/14/19 1203 Oral     SpO2 09/14/19 1203 100 %     Weight 09/14/19 1204 262 lb (118.8 kg)     Height 09/14/19 1204 6' (1.829 m)     Head Circumference --      Peak Flow --      Pain Score 09/14/19 1204 0     Pain Loc --      Pain Edu? --      Excl. in [place]? --    No data found.  Updated Vital Signs BP 135/79 (BP Location: Right Arm)   Pulse 92   Temp 98.2 F (36.8 C) (Oral)   Resp 20   Ht 6' (1.829 m)   Wt 118.8 kg   SpO2 100%   BMI 35.53 kg/m   Visual Acuity Right Eye Distance:   Left Eye Distance:   Bilateral Distance:    Right Eye Near:   Left Eye Near:    Bilateral Near:     Physical Exam Vitals and nursing note reviewed.  Constitutional:      Appearance: He is well-developed.  HENT:     Head: Normocephalic.     Right Ear: There  is impacted cerumen.     Left Ear: There is impacted cerumen.  Pulmonary:     Effort: Pulmonary effort is normal.  Abdominal:     General: There is no distension.  Musculoskeletal:     Cervical back: Normal range of motion.  Neurological:     Mental Status: He is alert and oriented to person, place, and time.      UC Treatments / Results  Labs (all labs ordered are listed, but only abnormal results are displayed) Labs Reviewed - No data to display  EKG   Radiology No results found.  Procedures Procedures (including critical care time)  Medications Ordered in UC Medications - No data to display  Initial Impression / Assessment and Plan / UC Course  I have reviewed the triage vital signs and the nursing notes.  Pertinent labs & imaging results that were available during my care of the patient were reviewed by me and considered in my medical decision making (see chart for details).     Ear canal's irrigated until clear  Final Clinical Impressions(s) / UC Diagnoses   Final diagnoses:  Bilateral impacted cerumen     Discharge Instructions     Return if any problems   ED Prescriptions    None     PDMP not reviewed this encounter.  An After Visit Summary was printed and given to the patient.   Fransico Meadow, [place] 09/14/19 1353

## 2019-09-14 NOTE — Discharge Instructions (Addendum)
Return if any problems.

## 2019-09-14 NOTE — ED Triage Notes (Signed)
Pt states that ear fullness has been going on for about 3 weeks, but worse the last 2 pain.  Denies pain.  Notices when wearing ear phones.

## 2019-09-19 ENCOUNTER — Other Ambulatory Visit: Payer: Self-pay

## 2019-09-19 ENCOUNTER — Emergency Department (INDEPENDENT_AMBULATORY_CARE_PROVIDER_SITE_OTHER)
Admission: EM | Admit: 2019-09-19 | Discharge: 2019-09-19 | Disposition: A | Discharge status: Home / Self Care | Payer: PRIVATE HEALTH INSURANCE | Source: Home / Self Care

## 2019-09-19 DIAGNOSIS — H6690 Otitis media, unspecified, unspecified ear: Secondary | ICD-10-CM

## 2019-09-19 MED ORDER — AMOXICILLIN 500 MG PO CAPS: 500.0000 mg | ORAL_CAPSULE | Freq: Three times a day (TID) | ORAL | 0 refills | Status: DC

## 2019-09-19 MED FILL — AMOXICILLIN 500 MG CAPSULE: 500 | 10 days supply | Qty: 30 | Fill #0

## 2019-09-19 NOTE — Discharge Instructions (Signed)
Return if any problems.

## 2019-09-19 NOTE — ED Triage Notes (Signed)
Pt c/o LT ear issues. Was here last week for impacted cerumen. After a couple days, says still experiencing a muffled hearing issue as well as feeling a tightness in his ear or feeling like his ear needs to "pop". Denies any pain.

## 2019-09-20 NOTE — ED Provider Notes (Signed)
[name]    CSN: [id number] Arrival date & time: 09/19/19  1545      History   Chief Complaint Chief Complaint  Patient presents with  . Ear issue    LT    HPI Leonard Lucas is a 29 y.o. male.   Pt complains of continued discomfort to his left ear.  Pt seen here a week ago by me. Pt had both ears irrigatted for wax.   The history is provided by the patient. No language interpreter was used.  Otalgia Location:  Left Quality:  Aching Severity:  Moderate Onset quality:  Gradual Timing:  Constant Progression:  Worsening Chronicity:  New Relieved by:  Nothing Worsened by:  Nothing Ineffective treatments:  None tried Associated symptoms: no ear discharge     Past Medical History:  Diagnosis Date  . Acute duodenal ulcer with bleeding 2016  . Anxiety   . Bilateral headaches   . Constipation   . Depression   . Duodenal ulcer hemorrhage   . Obesity   . Psoriasis   . PUD (peptic ulcer disease)   . Upper GI bleed 2016    Patient Active Problem List   Diagnosis Date Noted  . Psoriasis of scalp 01/07/2017  . Constipation   . Fecal impaction of colon ([place]) 12/10/2016  . Obesity, Class III, BMI 40-49.9 (morbid obesity) ([place]) 12/10/2016  . Erosive gastritis   . Leukocytosis   . GI bleed 01/21/2015  . Symptomatic anemia 01/21/2015  . History of migraine 01/21/2015  . Duodenal ulcer hemorrhage   . Bleeding gastrointestinal   . Social anxiety disorder 07/11/2014  . Depression, major, recurrent, moderate ([place]) 03/16/2012  . Generalized abdominal pain 02/02/2010    Past Surgical History:  Procedure Laterality Date  . ESOPHAGOGASTRODUODENOSCOPY N/A 01/21/2015   Procedure: ESOPHAGOGASTRODUODENOSCOPY (EGD);  Surgeon: Lafayette Dragon, MD;  Location: [facility] ENDOSCOPY;  Service: Endoscopy;  Laterality: N/A;  . WISDOM TOOTH EXTRACTION  08/2010       Home Medications    Prior to Admission medications   Medication Sig Start Date End Date Taking? Authorizing  Provider  amoxicillin (AMOXIL) 500 MG capsule Take 1 capsule (500 mg total) by mouth 3 (three) times daily. 09/19/19   Fransico Meadow, PA-C  buPROPion (WELLBUTRIN) 100 MG tablet Take 3 tablets (300 mg total) by mouth daily. 05/03/19   [name], MD  esomeprazole (NEXIUM) 40 MG capsule Take one cap by mouth twice daily, 20 to 30 minutes before a meal. 10/18/17   Kandra Nicolas, MD  omeprazole (PRILOSEC) 20 MG capsule Take 20 mg by mouth daily as needed (for heartburn/indigestion).    [provider]  FLUoxetine (PROZAC) 20 MG tablet Take 3 tablets (60 mg total) by mouth daily. 11/25/15 12/17/15  [name], MD    Family History Family History  Problem Relation Age of Onset  . Breast cancer Mother   . Depression Mother   . Depression Paternal Aunt   . Heart attack Neg Hx   . Stroke Neg Hx     Social History Social History   Tobacco Use  . Smoking status: Never Smoker  . Smokeless tobacco: Never Used  Substance Use Topics  . Alcohol use: No    Alcohol/week: 0.0 standard drinks  . Drug use: No     Allergies   Patient has no known allergies.   Review of Systems Review of Systems  HENT: Positive for ear pain. Negative for ear discharge.   All other  systems reviewed and are negative.    Physical Exam Triage Vital Signs ED Triage Vitals  Enc Vitals Group     BP 09/19/19 1609 126/85     Pulse Rate 09/19/19 1609 84     Resp 09/19/19 1609 18     Temp 09/19/19 1609 98.2 F (36.8 C)     Temp Source 09/19/19 1609 Oral     SpO2 09/19/19 1609 98 %     Weight 09/19/19 1610 260 lb 2.3 oz (118 kg)     Height 09/19/19 1610 6' (1.829 m)     Head Circumference --      Peak Flow --      Pain Score 09/19/19 1610 0     Pain Loc --      Pain Edu? --      Excl. in [place]? --    No data found.  Updated Vital Signs BP 126/85 (BP Location: Right Arm)   Pulse 84   Temp 98.2 F (36.8 C) (Oral)   Resp 18   Ht 6' (1.829 m)   Wt 118 kg   SpO2 98%   BMI 35.28 kg/m    Visual Acuity Right Eye Distance:   Left Eye Distance:   Bilateral Distance:    Right Eye Near:   Left Eye Near:    Bilateral Near:     Physical Exam Vitals and nursing note reviewed.  Constitutional:      Appearance: He is well-developed.  HENT:     Head: Normocephalic and atraumatic.     Left Ear: Tympanic membrane and external ear normal.     Ears:     Comments: Small piece of wax obscures tm Eyes:     Conjunctiva/sclera: Conjunctivae normal.  Cardiovascular:     Rate and Rhythm: Normal rate.     Heart sounds: No murmur.  Pulmonary:     Effort: Pulmonary effort is normal. No respiratory distress.  Abdominal:     Tenderness: There is no abdominal tenderness.  Skin:    General: Skin is dry.  Neurological:     General: No focal deficit present.     Mental Status: He is alert.  Psychiatric:        Mood and Affect: Mood normal.      UC Treatments / Results  Labs (all labs ordered are listed, but only abnormal results are displayed) Labs Reviewed - No data to display  EKG   Radiology No results found.  Procedures Procedures (including critical care time)  Medications Ordered in UC Medications - No data to display  Initial Impression / Assessment and Plan / UC Course  I have reviewed the triage vital signs and the nursing notes.  Pertinent labs & imaging results that were available during my care of the patient were reviewed by me and considered in my medical decision making (see chart for details).     MDM: wax wash out, small amount  TM appears dull.  Pt still has mufflec hearing.  I will treat with antibiotics.  Pt advised to see ENT for evaluation  Final Clinical Impressions(s) / UC Diagnoses   Final diagnoses:  Acute otitis media, unspecified otitis media type     Discharge Instructions     Return if any problems.    ED Prescriptions    Medication Sig Dispense Auth. Provider   amoxicillin (AMOXIL) 500 MG capsule Take 1 capsule (500 mg  total) by mouth 3 (three) times daily. 30 capsule Fransico Meadow, [place]  PDMP not reviewed this encounter.  An After Visit Summary was printed and given to the patient.    Fransico Meadow, [place] 09/20/19 [phone]

## 2019-10-01 MED FILL — buPROPion HCL 100 MG TABS: 100 | 30 days supply | Qty: 90 | Fill #5

## 2019-10-31 ENCOUNTER — Ambulatory Visit (INDEPENDENT_AMBULATORY_CARE_PROVIDER_SITE_OTHER): Payer: PRIVATE HEALTH INSURANCE | Admitting: Psychiatry

## 2019-10-31 ENCOUNTER — Encounter (HOSPITAL_COMMUNITY): Payer: Self-pay | Admitting: Psychiatry

## 2019-10-31 DIAGNOSIS — F4010 Social phobia, unspecified: Secondary | ICD-10-CM | POA: Diagnosis not present

## 2019-10-31 DIAGNOSIS — F411 Generalized anxiety disorder: Secondary | ICD-10-CM

## 2019-10-31 DIAGNOSIS — F331 Major depressive disorder, recurrent, moderate: Secondary | ICD-10-CM

## 2019-10-31 MED ORDER — BUPROPION HCL 100 MG PO TABS: 300.0000 mg | ORAL_TABLET | Freq: Every day | ORAL | 5 refills | Status: DC

## 2019-10-31 MED FILL — buPROPion HCL 100 MG TABS: 100 | 30 days supply | Qty: 90 | Fill #0

## 2019-10-31 NOTE — Progress Notes (Signed)
Patient ID: CAMARION DILLOW, male   DOB: 09-01-1990, 29 y.o.   MRN: [id number] [place] Follow-up Outpatient Visit  JOJO SIFERS [id number] 29 y.o.  10/31/2019  Chief Complaint: depression follow up    History of Present Illness:    I connected with [name] on 10/31/19 at 10:00 AM EST by telephone and verified that I am speaking with the correct person using two identifiers. I discussed the limitations, risks, security and privacy concerns of performing an evaluation and management service by telephone and the availability of in person appointments. I also discussed with the patient that there may be a patient responsible charge related to this service. The patient expressed understanding and agreed to proceed.   Mr. Cossin is a 29 y/o male with a past psychiatric history significant for symptoms of depression and anxiety.    Doing fair on meds, brother on [employer] training so he lives by himself. 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 Acute duodenal ulcer with bleeding 2016  . Anxiety   . Bilateral headaches   . Constipation   . Depression   . Duodenal ulcer hemorrhage   . Obesity   . Psoriasis   . PUD (peptic ulcer disease)   . Upper GI bleed 2016   Family History  Problem Relation Age of Onset  . Breast cancer Mother   . Depression Mother   . Depression Paternal Aunt   . Heart attack Neg Hx   . Stroke Neg Hx     Outpatient Encounter Medications as of 10/31/2019  Medication Sig  . amoxicillin (AMOXIL) 500 MG capsule Take 1 capsule (500 mg total) by mouth 3 (three) times daily.  Marland Kitchen buPROPion (WELLBUTRIN) 100 MG tablet Take 3 tablets (300 mg total) by mouth daily.  Marland Kitchen esomeprazole (NEXIUM) 40 MG capsule Take one cap by mouth twice daily, 20 to 30 minutes before a meal.  . omeprazole (PRILOSEC) 20 MG capsule Take 20 mg by mouth daily as needed  (for heartburn/indigestion).  . [DISCONTINUED] buPROPion (WELLBUTRIN) 100 MG tablet Take 3 tablets (300 mg total) by mouth daily.  . [DISCONTINUED] FLUoxetine (PROZAC) 20 MG tablet Take 3 tablets (60 mg total) by mouth daily.   No facility-administered encounter medications on file as of 10/31/2019.    No results found for this or any previous visit (from the past 2160 hour(s)).  There were no vitals taken for this visit.   Review of Systems  Psychiatric/Behavioral: Negative for substance abuse and suicidal ideas.    Mental Status Examination  Appearance: casual' Alert: Yes Attention: fair  Cooperative: Yes Eye Contact: Fair Speech: normal tone Psychomotor Activity: Decreased Memory/Concentration: adequate  Oriented: person, place and time/date Mood:fair Affect: congruent and pleasant Thought Processes and Associations: Coherent Fund of Knowledge: Fair Thought Content: Suicidal ideation and Homicidal ideation were denied Insight: Fair Judgement: Fair  Diagnosis: Maj. depressive disorder recurrent moderate . Obsessive-compulsive traits. Generalized anxiety disorder  Treatment Plan:  MDD: doing stable. Continue wellbutrin 300mg  a day Add more activities for the day  [id number] or not worse. No meds needed for now Fu 6-9 months. Renewed meds     I discussed the assessment and treatment plan with the patient. The patient was provided an opportunity to ask questions and all were answered. The patient agreed with the plan and demonstrated an understanding of the instructions.  The patient was advised to call back or seek an in-person evaluation if the symptoms worsen or if the condition fails to improve as anticipated. Non face to face time spent: 17min.   [name], MD

## 2019-12-05 ENCOUNTER — Other Ambulatory Visit: Payer: Self-pay

## 2019-12-05 ENCOUNTER — Ambulatory Visit (INDEPENDENT_AMBULATORY_CARE_PROVIDER_SITE_OTHER): Payer: PRIVATE HEALTH INSURANCE | Admitting: Family Medicine

## 2019-12-05 ENCOUNTER — Encounter: Payer: Self-pay | Admitting: Family Medicine

## 2019-12-05 DIAGNOSIS — I872 Venous insufficiency (chronic) (peripheral): Secondary | ICD-10-CM

## 2019-12-05 DIAGNOSIS — F331 Major depressive disorder, recurrent, moderate: Secondary | ICD-10-CM

## 2019-12-05 MED FILL — buPROPion HCL 100 MG TABS: 100 | 30 days supply | Qty: 90 | Fill #1

## 2019-12-05 NOTE — Assessment & Plan Note (Signed)
Color changes related to hemosiderin deposition.  Recommend continued use of compression stockings, 20-26mmhg.  Try to walk if standing for prolonged periods.  Elevate legs while at rest.

## 2019-12-05 NOTE — Progress Notes (Signed)
Leonard Lucas - 29 y.o. male MRN [id number]  Date of birth: 03-10-1991  Subjective Chief Complaint  Patient presents with  . Establish Care    HPI Leonard Lucas is a 29 y.o. male with history of depression, duodenal ulcer and GERD here today for initial visit.   He is followed by Dr. De Nurse for management of depression and is treated with bupropion.  He reports that this is well controlled.   Reflux symptoms are well managed with nexium.  He is doing well with this and denies abdominal pain or nausea.  Bowels are moving normally.    Reports some discoloration to legs.  L>R.  Thinks it may due to blood pooling due to standing for long periods of time at work and leaning against his L leg.    He denies pain or swelling.  He has started wearing compression stockings with some improvement.    ROS:  A comprehensive ROS was completed and negative except as noted per HPI    No Known Allergies  Past Medical History:  Diagnosis Date  . Acute duodenal ulcer with bleeding 2016  . Anxiety   . Bilateral headaches   . Constipation   . Depression   . Duodenal ulcer hemorrhage   . Leg pain   . Obesity   . Psoriasis   . PUD (peptic ulcer disease)   . Upper GI bleed 2016    Past Surgical History:  Procedure Laterality Date  . ESOPHAGOGASTRODUODENOSCOPY N/A 01/21/2015   Procedure: ESOPHAGOGASTRODUODENOSCOPY (EGD);  Surgeon: Lafayette Dragon, MD;  Location[facility] ENDOSCOPY;  Service: Endoscopy;  Laterality: N/A;  . WISDOM TOOTH EXTRACTION  08/2010    Social History   Socioeconomic History  . Marital status: Single    Spouse name: Not on file  . Number of children: Not on file  . Years of education: Not on file  . Highest education level: Not on file  Occupational History  . Not on file  Tobacco Use  . Smoking status: Never Smoker  . Smokeless tobacco: Never Used  Substance and Sexual Activity  . Alcohol use: No    Alcohol/week: 0.0 standard drinks  . Drug use: No  . Sexual activity:  Never  Other Topics Concern  . Not on file  Social History Narrative  . Not on file   Social Determinants of Health   Financial Resource Strain:   . Difficulty of Paying Living Expenses:   Food Insecurity:   . Worried About Charity fundraiser in the Last Year:   . Arboriculturist in the Last Year:   Transportation Needs:   . Film/video editor (Medical):   Marland Kitchen Lack of Transportation (Non-Medical):   Physical Activity:   . Days of Exercise per Week:   . Minutes of Exercise per Session:   Stress:   . Feeling of Stress :   Social Connections:   . Frequency of Communication with Friends and Family:   . Frequency of Social Gatherings with Friends and Family:   . Attends Religious Services:   . Active Member of Clubs or Organizations:   . Attends Archivist Meetings:   Marland Kitchen Marital Status:     Family History  Problem Relation Age of Onset  . Breast cancer Mother   . Depression Mother   . Depression Paternal Aunt   . Heart attack Neg Hx   . Stroke Neg Hx     Health Maintenance  Topic Date Due  .  INFLUENZA VACCINE  03/23/2020  . TETANUS/TDAP  08/23/2020  . HIV Screening  Completed     ----------------------------------------------------------------------------------------------------------------------------------------------------------------------------------------------------------------- Physical Exam BP 124/81   Pulse 69   Temp 97.9 F (36.6 C) (Oral)   Ht 6' (1.829 m)   Wt 254 lb (115.2 kg)   BMI 34.45 kg/m   Physical Exam Constitutional:      Appearance: Normal appearance.  HENT:     Head: Normocephalic and atraumatic.  Eyes:     General: No scleral icterus. Cardiovascular:     Rate and Rhythm: Normal rate and regular rhythm.     Pulses: Normal pulses.  Pulmonary:     Effort: Pulmonary effort is normal.     Breath sounds: Normal breath sounds.  Musculoskeletal:     Cervical back: Neck supple.     Comments: Calf non tender.   Skin:     General: Skin is warm and dry.     Comments: Darkened coloration of skin around bilateral ankles, L >R. No mottling or cyanosis noted.   Neurological:     Mental Status: He is alert.     ------------------------------------------------------------------------------------------------------------------------------------------------------------------------------------------------------------------- Assessment and Plan  Depression, major, recurrent, moderate (HCC) Stable with bupropion. He is followed by psychiatry currently.   Venous insufficiency of both lower extremities Color changes related to hemosiderin deposition.  Recommend continued use of compression stockings, 20-28mmhg.  Try to walk if standing for prolonged periods.  Elevate legs while at rest.    No orders of the defined types were placed in this encounter.   No follow-ups on file.    This visit occurred during the SARS-CoV-2 public health emergency.  Safety protocols were in place, including screening questions prior to the visit, additional usage of staff PPE, and extensive cleaning of exam room while observing appropriate contact time as indicated for disinfecting solutions.

## 2019-12-05 NOTE — Patient Instructions (Signed)
Try compression stocking with 20-30 mmhg Try to walk around if on your feet for extended periods Elevate legs when at rest.   Chronic Venous Insufficiency Chronic venous insufficiency is a condition where the leg veins cannot effectively pump blood from the legs to the heart. This happens when the vein walls are either stretched, weakened, or damaged, or when the valves inside the vein are damaged. With the right treatment, you should be able to continue with an active life. This condition is also called venous stasis. What are the causes? Common causes of this condition include:  High blood pressure inside the veins (venous hypertension).  Sitting or standing too long, causing increased blood pressure in the leg veins.  A blood clot that blocks blood flow in a vein (deep vein thrombosis, DVT).  Inflammation of a vein (phlebitis) that causes a blood clot to form.  Tumors in the pelvis that cause blood to back up. What increases the risk? The following factors may make you more likely to develop this condition:  Having a family history of this condition.  Obesity.  Pregnancy.  Living without enough regular physical activity or exercise (sedentary lifestyle).  Smoking.  Having a job that requires long periods of standing or sitting in one place.  Being a certain age. Women in their 65s and 77s and men in their 80s are more likely to develop this condition. What are the signs or symptoms? Symptoms of this condition include:  Veins that are enlarged, bulging, or twisted (varicose veins).  Skin breakdown or ulcers.  Reddened skin or dark discoloration of skin on the leg between the knee and ankle.  Brown, smooth, tight, and painful skin just above the ankle, usually on the inside of the leg (lipodermatosclerosis).  Swelling of the legs. How is this diagnosed? This condition may be diagnosed based on:  Your medical history.  A physical exam.  Tests, such as: ? A  procedure that creates an image of a blood vessel and nearby organs and provides information about blood flow through the blood vessel (duplex ultrasound). ? A procedure that tests blood flow (plethysmography). ? A procedure that looks at the veins using X-ray and dye (venogram). How is this treated? The goals of treatment are to help you return to an active life and to minimize pain or disability. Treatment depends on the severity of your condition, and it may include:  Wearing compression stockings. These can help relieve symptoms and help prevent your condition from getting worse. However, they do not cure the condition.  Sclerotherapy. This procedure involves an injection of a solution that shrinks damaged veins.  Surgery. This may involve: ? Removing a diseased vein (vein stripping). ? Cutting off blood flow through the vein (laser ablation surgery). ? Repairing or reconstructing a valve within the affected vein. Follow these instructions at home:      Wear compression stockings as told by your health care provider. These stockings help to prevent blood clots and reduce swelling in your legs.  Take over-the-counter and prescription medicines only as told by your health care provider.  Stay active by exercising, walking, or doing different activities. Ask your health care provider what activities are safe for you and how much exercise you need.  Drink enough fluid to keep your urine pale yellow.  Do not use any products that contain nicotine or tobacco, such as cigarettes, e-cigarettes, and chewing tobacco. If you need help quitting, ask your health care provider.  Keep all follow-up visits  as told by your health care provider. This is important. Contact a health care provider if you:  Have redness, swelling, or more pain in the affected area.  See a red streak or line that goes up or down from the affected area.  Have skin breakdown or skin loss in the affected area, even if  the breakdown is small.  Get an injury in the affected area. Get help right away if:  You get an injury and an open wound in the affected area.  You have: ? Severe pain that does not get better with medicine. ? Sudden numbness or weakness in the foot or ankle below the affected area. ? Trouble moving your foot or ankle. ? A fever. ? Worse or persistent symptoms. ? Chest pain. ? Shortness of breath. Summary  Chronic venous insufficiency is a condition where the leg veins cannot effectively pump blood from the legs to the heart.  Chronic venous insufficiency occurs when the vein walls become stretched, weakened, or damaged, or when valves within the vein are damaged.  Treatment depends on how severe your condition is. It often involves wearing compression stockings and may involve having a procedure.  Make sure you stay active by exercising, walking, or doing different activities. Ask your health care provider what activities are safe for you and how much exercise you need. This information is not intended to replace advice given to you by your health care provider. Make sure you discuss any questions you have with your health care provider. Document Revised: 05/02/2018 Document Reviewed: 05/02/2018 Elsevier Patient Education  [place].

## 2019-12-05 NOTE — Assessment & Plan Note (Signed)
Stable with bupropion. He is followed by psychiatry currently.

## 2020-01-02 ENCOUNTER — Ambulatory Visit (INDEPENDENT_AMBULATORY_CARE_PROVIDER_SITE_OTHER): Payer: PRIVATE HEALTH INSURANCE | Admitting: Family Medicine

## 2020-01-02 ENCOUNTER — Other Ambulatory Visit: Payer: Self-pay

## 2020-01-02 ENCOUNTER — Encounter: Payer: Self-pay | Admitting: Family Medicine

## 2020-01-02 DIAGNOSIS — S56911A Strain of unspecified muscles, fascia and tendons at forearm level, right arm, initial encounter: Secondary | ICD-10-CM | POA: Diagnosis not present

## 2020-01-02 DIAGNOSIS — S56919A Strain of unspecified muscles, fascia and tendons at forearm level, unspecified arm, initial encounter: Secondary | ICD-10-CM | POA: Insufficient documentation

## 2020-01-02 NOTE — Assessment & Plan Note (Signed)
Tremor likely related to muscle fatigue/strain. I think this will improve with time.  He may have some degree of mild carpal tunnel syndrome as well.  Don't really think this is essential tremor but if not improving we can re-assess.

## 2020-01-02 NOTE — Progress Notes (Signed)
Leonard Lucas - 29 y.o. male MRN [id number]  Date of birth: [birth year]  Subjective Chief Complaint  Patient presents with  . Tremors    HPI Leonard Lucas is a 29 y.o. male here today with complaint of tremor in R hand.  He first noticed a few days ago.  Reports getting frustrated and squeezing his computer mouse in an attempt to break it. Had some tightness in his arm and now when trying to hold a pen to draw he has noticed tremor with fine movement.  He does have some mild tingling in the first and second digit, radiating from his wrist.  He denies pain or swelling.  There is no tremor at rest.   ROS:  A comprehensive ROS was completed and negative except as noted per HPI  No Known Allergies  Past Medical History:  Diagnosis Date  . Acute duodenal ulcer with bleeding 2016  . Anxiety   . Bilateral headaches   . Constipation   . Depression   . Duodenal ulcer hemorrhage   . Leg pain   . Obesity   . Psoriasis   . PUD (peptic ulcer disease)   . Upper GI bleed 2016    Past Surgical History:  Procedure Laterality Date  . ESOPHAGOGASTRODUODENOSCOPY N/A 01/21/2015   Procedure: ESOPHAGOGASTRODUODENOSCOPY (EGD);  Surgeon: Lafayette Dragon, MD;  Location: [facility] ENDOSCOPY;  Service: Endoscopy;  Laterality: N/A;  . WISDOM TOOTH EXTRACTION  08/2010    Social History   Socioeconomic History  . Marital status: Single    Spouse name: Not on file  . Number of children: Not on file  . Years of education: Not on file  . Highest education level: Not on file  Occupational History  . Not on file  Tobacco Use  . Smoking status: Never Smoker  . Smokeless tobacco: Never Used  Substance and Sexual Activity  . Alcohol use: No    Alcohol/week: 0.0 standard drinks  . Drug use: No  . Sexual activity: Never  Other Topics Concern  . Not on file  Social History Narrative  . Not on file   Social Determinants of Health   Financial Resource Strain:   . Difficulty of Paying Living Expenses:   Food  Insecurity:   . Worried About Charity fundraiser in the Last Year:   . Arboriculturist in the Last Year:   Transportation Needs:   . Film/video editor (Medical):   Marland Kitchen Lack of Transportation (Non-Medical):   Physical Activity:   . Days of Exercise per Week:   . Minutes of Exercise per Session:   Stress:   . Feeling of Stress :   Social Connections:   . Frequency of Communication with Friends and Family:   . Frequency of Social Gatherings with Friends and Family:   . Attends Religious Services:   . Active Member of Clubs or Organizations:   . Attends Archivist Meetings:   Marland Kitchen Marital Status:     Family History  Problem Relation Age of Onset  . Breast cancer Mother   . Depression Mother   . Depression Paternal Aunt   . Heart attack Neg Hx   . Stroke Neg Hx     Health Maintenance  Topic Date Due  . INFLUENZA VACCINE  03/23/2020  . TETANUS/TDAP  08/23/2020  . COVID-19 Vaccine  Completed  . HIV Screening  Completed     ----------------------------------------------------------------------------------------------------------------------------------------------------------------------------------------------------------------- Physical Exam BP 139/83 (BP Location: Left Arm, Patient  Position: Sitting, Cuff Size: Large)   Pulse 80   Ht 6' 0.05" (1.83 m)   Wt 251 lb 9.6 oz (114.1 kg)   SpO2 99%   BMI 34.08 kg/m   Physical Exam Constitutional:      Appearance: Normal appearance.  HENT:     Head: Normocephalic and atraumatic.  Musculoskeletal:     Comments: Wrist and hand normal to inspection and palpation.  ROM of wrist and hand is normal Good strength throughout.  Mildly positive tinel sign at carpal tunnel  Neurological:     General: No focal deficit present.     Mental Status: He is alert and oriented to person, place, and time.  Psychiatric:        Mood and Affect: Mood normal.        Behavior: Behavior normal.      ------------------------------------------------------------------------------------------------------------------------------------------------------------------------------------------------------------------- Assessment and Plan  Muscle strain of forearm Tremor likely related to muscle fatigue/strain. I think this will improve with time.  He may have some degree of mild carpal tunnel syndrome as well.  Don't really think this is essential tremor but if not improving we can re-assess.    No orders of the defined types were placed in this encounter.   No follow-ups on file.    This visit occurred during the SARS-CoV-2 public health emergency.  Safety protocols were in place, including screening questions prior to the visit, additional usage of staff PPE, and extensive cleaning of exam room while observing appropriate contact time as indicated for disinfecting solutions.

## 2020-01-02 NOTE — Patient Instructions (Signed)
Preventing Carpal Tunnel Syndrome  Carpal tunnel syndrome is a condition that causes pain, numbness, and weakness in the wrist, hand, and fingers. The carpal tunnel is a narrow, hollow space in the wrist. Tendons and one of the main nerves in the hand (median nerve) pass through the carpal tunnel. The median nerve supplies feeling to the thumb and the first three fingers. It also supplies the muscles at the base of the thumb. Carpal tunnel syndrome happens when the median nerve gets squeezed in the area where it passes through the carpal tunnel. In some cases, it may not be possible to prevent carpal tunnel syndrome. However, you can take steps to relieve pressure on your wrist and reduce your risk of developing this condition. How can this condition affect me? Carpal tunnel syndrome can affect your ability to do jobs or activities that involve hand, wrist, and finger action. It can cause symptoms such as:  Pain in the wrist, hand, and fingers.  Burning, tingling, or numbness in the affected area.  A weak feeling in your hands. You may have trouble grabbing and holding items. Symptoms may get worse over time. For some people, symptoms get worse at night. What can increase my risk? The following factors may make you more likely to develop this condition:  Having a job that requires you to repeatedly move your wrist or requires you to use tools that vibrate. This may include jobs that involve using computers, working on an [employer], or working with [employer] such as Pension scheme manager.  Being a woman.  Having a family history of the condition.  Having certain conditions, such as: ? Diabetes. ? Pregnancy. ? Obesity. ? Thyroid disease. ? Rheumatoid arthritis. What actions can I take to help prevent this condition?      Avoid making repetitive hand and wrist motions that cause your wrist to get stiff or painful.  Take frequent breaks if you use your hands and wrists for many hours  at a time.  Stretch your hands and fingers often to get blood flowing and relieve tension.  Keep your wrists in the natural position when using a computer keyboard or mouse. Do not bend your wrists downward or sideways.  If you use your hands and wrists for many hours at work, make changes to your work space to ease pressure on your wrists. You may want to use: ? A padded wrist rest for computer work. ? A slanted computer keyboard. ? Hand tools with padded handles to reduce vibrations.  Consider wearing a wrist brace. This will not prevent carpal tunnel syndrome but may keep it from getting worse. A wrist brace reduces bending and stress.  Closely manage any medical conditions you have that can put you at risk for carpal tunnel syndrome. Have your blood sugar checked to make sure you are not developing diabetes. If you have diabetes, work with your health care provider to keep your blood sugar under control. Where to find more information  [employer] of Neurological Disorders and Stroke: [email or web address]  [place] [facility]: [email or web address] Contact a health care provider if:  You have numbness or tingling in your wrist, hand, or fingers.  You have pain or a burning sensation in your wrist, hand, or fingers.  Pain, tingling, or burning wakes you up at night.  Your hand becomes weak and clumsy.  You frequently drop objects.  You are unable to use your wrists and hands without pain. Summary  Carpal tunnel syndrome is  a condition that causes pain, numbness, and weakness in the wrist, hand, and fingers.  You can take steps to relieve pressure on your wrist and reduce your risk of developing this condition.  Avoid making repetitive hand and wrist motions that cause your wrist to get stiff or painful.  If you use your hands and wrists for many hours at work, you may want to make changes to your work space to ease pressure on your wrists.  Take frequent  breaks to stretch your hands and fingers. This information is not intended to replace advice given to you by your health care provider. Make sure you discuss any questions you have with your health care provider. Document Revised: 12/22/2017 Document Reviewed: 12/22/2017 Elsevier Patient Education  [place].

## 2020-03-03 MED FILL — buPROPion HCL 100 MG TABS: 100 | 30 days supply | Qty: 90 | Fill #4

## 2020-04-01 MED FILL — buPROPion HCL 100 MG TABS: 100 | 30 days supply | Qty: 90 | Fill #5

## 2020-05-05 ENCOUNTER — Telehealth (INDEPENDENT_AMBULATORY_CARE_PROVIDER_SITE_OTHER): Payer: PRIVATE HEALTH INSURANCE | Admitting: Psychiatry

## 2020-05-05 ENCOUNTER — Encounter (HOSPITAL_COMMUNITY): Payer: Self-pay | Admitting: Psychiatry

## 2020-05-05 DIAGNOSIS — F331 Major depressive disorder, recurrent, moderate: Secondary | ICD-10-CM | POA: Diagnosis not present

## 2020-05-05 DIAGNOSIS — F411 Generalized anxiety disorder: Secondary | ICD-10-CM | POA: Diagnosis not present

## 2020-05-05 MED ORDER — BUPROPION HCL 100 MG PO TABS: 300.0000 mg | ORAL_TABLET | Freq: Every day | ORAL | 5 refills | Status: DC

## 2020-05-05 MED FILL — buPROPion HCL 100 MG TABS: 100 | 30 days supply | Qty: 90 | Fill #0

## 2020-05-05 NOTE — Progress Notes (Signed)
Patient ID: Leonard Lucas, male   DOB: 28-Dec-1990, 29 y.o.   MRN: [id number] Leonard Lucas Follow-up Outpatient Visit  COLBIN JOVEL [id number] 28 y.o.  05/05/2020  Chief Complaint: depression follow up    History of Present Illness:     I connected with [name] on 05/05/20 at 10:00 AM EDT by telephone and verified that I am speaking with the correct person using two identifiers. I discussed the limitations, risks, security and privacy concerns of performing an evaluation and management service by telephone and the availability of in person appointments. I also discussed with the patient that there may be a patient responsible charge related to this service. The patient expressed understanding and agreed to proceed.  Patient location home Provider location: home office   Leonard Lucas is a 29 y/o male with a past psychiatric history significant for symptoms of depression and anxiety.   Doing stable on wellbutrin. Handling stress Currently on un employment Moving to [place] near his mother. Wants refill and will look for psych services there    Feels meds work fair does not want to change Duration more then 10 years    Past Medical History:  Diagnosis Date  . Acute duodenal ulcer with bleeding 2016  . Anxiety   . Bilateral headaches   . Constipation   . Depression   . Duodenal ulcer hemorrhage   . Leg pain   . Obesity   . Psoriasis   . PUD (peptic ulcer disease)   . Upper GI bleed 2016   Family History  Problem Relation Age of Onset  . Breast cancer Mother   . Depression Mother   . Depression Paternal Aunt   . Heart attack Neg Hx   . Stroke Neg Hx     Outpatient Encounter Medications as of 05/05/2020  Medication Sig  . buPROPion (WELLBUTRIN) 100 MG tablet Take 3 tablets (300 mg total) by mouth daily.  Marland Kitchen esomeprazole (NEXIUM) 40 MG capsule Take one cap by mouth twice daily, 20 to 30 minutes before a meal.  . omeprazole (PRILOSEC) 20 MG capsule Take 20  mg by mouth daily as needed (for heartburn/indigestion).  . [DISCONTINUED] buPROPion (WELLBUTRIN) 100 MG tablet Take 3 tablets (300 mg total) by mouth daily.  . [DISCONTINUED] FLUoxetine (PROZAC) 20 MG tablet Take 3 tablets (60 mg total) by mouth daily.   No facility-administered encounter medications on file as of 05/05/2020.    No results found for this or any previous visit (from the past 2160 hour(s)).  There were no vitals taken for this visit.   Review of Systems  Psychiatric/Behavioral: Negative for substance abuse and suicidal ideas.    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 Generalized anxiety disorder  Treatment Plan:  MDD: stable, continue wellbutrin Add more activities for the day  [facility]:[id number] or not worse. No meds needed for now Fu 59m Moving to [place], can be discharged, we can send 6 months medications Call back for concerns. He agrees to plan and will find a provider there     I discussed the assessment and treatment plan with the patient. The patient  was provided an opportunity to ask questions and all were answered. The patient agreed with the plan and demonstrated an understanding of the instructions.   The patient was advised to call back or seek an in-person evaluation if the symptoms worsen or if the condition fails to improve as anticipated. Non face to face time spent: 67min.   Leonard Capron, MD

## 2020-11-06 ENCOUNTER — Telehealth (HOSPITAL_COMMUNITY): Payer: Self-pay | Admitting: *Deleted

## 2020-11-06 ENCOUNTER — Inpatient Hospital Stay: Admit: 2020-11-06
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
## 2020-11-06 NOTE — Other (Unsigned)
Patient Acct Nbr: [id number]   Primary AUTH/CERT:   Primary Insurance Company Name: [employer] [place]  Primary Insurance Plan name: [facility] SuperMed  Primary Insurance Group Number: [id number]  Primary Insurance Plan Type: Health  Primary Insurance Policy Number: [id number]

## 2020-11-06 NOTE — Telephone Encounter (Signed)
Patient left message at [facility] office.  He moved to [place] in September and was given a script for Wellbutrin with 5 refills.  He does not have a provider in [place].  I have been unable to reach him as his phone number has no mechanism to leave a message. FYI

## 2020-11-11 ENCOUNTER — Other Ambulatory Visit (HOSPITAL_COMMUNITY): Payer: Self-pay | Admitting: Psychiatry

## 2020-11-11 ENCOUNTER — Telehealth (HOSPITAL_COMMUNITY): Payer: Self-pay | Admitting: Psychiatry

## 2020-11-11 MED ORDER — BUPROPION HCL 100 MG PO TABS: 300.0000 mg | ORAL_TABLET | Freq: Every day | ORAL | 0 refills | Status: DC

## 2020-11-11 NOTE — Telephone Encounter (Signed)
Dr De Nurse is out of the office today and does not have any availability until next week. Pt will be out of medication.  Can we please send in refill on  wellbutrin 300mg  to [facility]   CB # [phone]  Can you please send this in dr Evalina Field absence?

## 2020-11-11 NOTE — Telephone Encounter (Signed)
sent 

## 2020-11-12 NOTE — Telephone Encounter (Signed)
[facility] can you send wellbutrin to [facility] last pharmacy sent for one month , just in case . He should connect with new provider as well

## 2020-11-12 NOTE — Telephone Encounter (Signed)
This was already taken care of by Dr. Melanee Left.  Nothing Further Needed at this time.

## 2020-11-19 ENCOUNTER — Encounter (HOSPITAL_COMMUNITY): Payer: Self-pay | Admitting: Psychiatry

## 2020-11-19 ENCOUNTER — Telehealth (INDEPENDENT_AMBULATORY_CARE_PROVIDER_SITE_OTHER): Payer: PRIVATE HEALTH INSURANCE | Admitting: Psychiatry

## 2020-11-19 DIAGNOSIS — F331 Major depressive disorder, recurrent, moderate: Secondary | ICD-10-CM

## 2020-11-19 DIAGNOSIS — F411 Generalized anxiety disorder: Secondary | ICD-10-CM

## 2020-11-19 MED ORDER — BUPROPION HCL 100 MG PO TABS: 300.0000 mg | ORAL_TABLET | Freq: Every day | ORAL | 2 refills | Status: AC

## 2020-11-19 NOTE — Progress Notes (Signed)
Patient ID: Leonard Lucas, male   DOB: [birth year], 30 y.o.   MRN: [id number] [place] Follow-up Outpatient Visit  Leonard Lucas [id number] 29 y.o.  11/19/2020 Virtual Visit via Telephone Note  I connected with [place] Lucas on 11/19/20 at  1:45 PM EDT by telephone and verified that I am speaking with the correct person using two identifiers.  Location: Patient: home Provider: home office   I discussed the limitations, risks, security and privacy concerns of performing an evaluation and management service by telephone and the availability of in person appointments. I also discussed with the patient that there may be a patient responsible charge related to this service. The patient expressed understanding and agreed to proceed.     I discussed the assessment and treatment plan with the patient. The patient was provided an opportunity to ask questions and all were answered. The patient agreed with the plan and demonstrated an understanding of the instructions.   The patient was advised to call back or seek an in-person evaluation if the symptoms worsen or if the condition fails to improve as anticipated.  I provided 11  minutes of non-face-to-face time during this encounter.    Chief Complaint: depression follow up    History of Present Illness:      Leonard Lucas is a 30 y/o male  Has moved to [place] but psych appointment is in June, wants refill continues to do well on welbutrin Family is supportive and adjusting     Feels meds work fair does not want to change Duration more then 10 years    Past Medical History:  Diagnosis Date  . Acute duodenal ulcer with bleeding 2016  . Anxiety   . Bilateral headaches   . Constipation   . Depression   . Duodenal ulcer hemorrhage   . Leg pain   . Obesity   . Psoriasis   . PUD (peptic ulcer disease)   . Upper GI bleed 2016   Family History  Problem Relation Age of Onset  . Breast cancer Mother   . Depression  Mother   . Depression Paternal Aunt   . Heart attack Neg Hx   . Stroke Neg Hx     Outpatient Encounter Medications as of 11/19/2020  Medication Sig  . buPROPion (WELLBUTRIN) 100 MG tablet Take 3 tablets (300 mg total) by mouth daily.  Marland Kitchen esomeprazole (NEXIUM) 40 MG capsule Take one cap by mouth twice daily, 20 to 30 minutes before a meal.  . omeprazole (PRILOSEC) 20 MG capsule Take 20 mg by mouth daily as needed (for heartburn/indigestion).  . [DISCONTINUED] buPROPion (WELLBUTRIN) 100 MG tablet Take 3 tablets (300 mg total) by mouth daily.  . [DISCONTINUED] FLUoxetine (PROZAC) 20 MG tablet Take 3 tablets (60 mg total) by mouth daily.   No facility-administered encounter medications on file as of 11/19/2020.    No results found for this or any previous visit (from the past 2160 hour(s)).  There were no vitals taken for this visit.   Review of Systems  Psychiatric/Behavioral: Negative for substance abuse and suicidal ideas.    Mental Status Examination  Appearance:  Alert: Yes Attention: fair  Cooperative: Yes Eye Contact:  Speech: normal tone Psychomotor Activity: fair Memory/Concentration: adequate  Oriented: person, place and time/date Mood: stable, fair Affect: congruent and pleasant Thought Processes and Associations: Coherent Fund of Knowledge: Fair Thought Content: Suicidal ideation and Homicidal ideation were denied Insight: Fair Judgement: Fair  Diagnosis: Maj. depressive disorder recurrent moderate .  Obsessive-compulsive traits. Generalized anxiety disorder  Treatment Plan:  MDD: remains stable, continue wellbutrin, has psych appointment in June in [place], can be discharged but will send refills  Add more activities for the day  DTP:[id number],  FU in [place], refills sent to cover till appointment there   Leonard Capron, MD
